# Patient Record
Sex: Female | Born: 1990
Health system: Southern US, Community
[De-identification: ages and names within clinical notes are randomized; demographics above are authoritative.]

## PROBLEM LIST (undated history)

## (undated) DIAGNOSIS — I10 Essential (primary) hypertension: Secondary | ICD-10-CM

## (undated) DIAGNOSIS — F3132 Bipolar disorder, current episode depressed, moderate: Secondary | ICD-10-CM

## (undated) DIAGNOSIS — L918 Other hypertrophic disorders of the skin: Secondary | ICD-10-CM

## (undated) HISTORY — DX: Essential (primary) hypertension: I10

## (undated) HISTORY — DX: Other hypertrophic disorders of the skin: L91.8

## (undated) HISTORY — DX: Morbid (severe) obesity due to excess calories: E66.01

## (undated) HISTORY — PX: KNEE ARTHROSCOPY: SUR90

## (undated) HISTORY — PX: KNEE SURGERY: SHX244

## (undated) HISTORY — DX: Bipolar disorder, current episode depressed, moderate: F31.32

## (undated) HISTORY — PX: INNER EAR SURGERY: SHX679

---
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
## 2014-12-27 ENCOUNTER — Encounter (HOSPITAL_COMMUNITY): Payer: Self-pay | Admitting: Physical Medicine and Rehabilitation

## 2014-12-27 ENCOUNTER — Emergency Department (HOSPITAL_COMMUNITY)
Admission: EM | Admit: 2014-12-27 | Discharge: 2014-12-27 | Discharge status: Against Medical Advice | Payer: Self-pay | Attending: Emergency Medicine | Admitting: Emergency Medicine

## 2014-12-27 DIAGNOSIS — F329 Major depressive disorder, single episode, unspecified: Secondary | ICD-10-CM | POA: Insufficient documentation

## 2014-12-27 DIAGNOSIS — Z046 Encounter for general psychiatric examination, requested by authority: Secondary | ICD-10-CM | POA: Insufficient documentation

## 2014-12-27 NOTE — ED Notes (Signed)
Patient states has been to [facility] and "I feel like a druggie or something.  I don't think that's where I need to be".

## 2014-12-27 NOTE — ED Notes (Signed)
Pt presents to department for evaluation of depression and severe mood swings. Reports crying and fits of anger. Denies SI/HI at the time. Pt is alert and oriented x4.

## 2014-12-27 NOTE — ED Notes (Signed)
Patient came to nurses station to advise that she spoke with her primary doctor and that she agreed to work patient in with payment plan.   Patient states she wanted to see her primary.

## 2014-12-27 NOTE — ED Provider Notes (Signed)
Patient here to be seen for mood swings and depression as recommended by her PCP. No Si/Hi  She decided to leave after staff told her that we don't write psych medications here and that she would most likely get  Resources. Patient eloped without my knowing and I was never able to speak with her or examine her.    Marlon Pel, PA-C 12/27/14 1513  Gwyneth Sprout, MD 12/27/14 2219

## 2015-09-18 ENCOUNTER — Emergency Department (HOSPITAL_BASED_OUTPATIENT_CLINIC_OR_DEPARTMENT_OTHER): Payer: BLUE CROSS/BLUE SHIELD

## 2015-09-18 ENCOUNTER — Encounter (HOSPITAL_BASED_OUTPATIENT_CLINIC_OR_DEPARTMENT_OTHER): Payer: Self-pay | Admitting: *Deleted

## 2015-09-18 ENCOUNTER — Emergency Department (HOSPITAL_BASED_OUTPATIENT_CLINIC_OR_DEPARTMENT_OTHER)
Admission: EM | Admit: 2015-09-18 | Discharge: 2015-09-19 | Disposition: A | Discharge status: Home / Self Care | Payer: BLUE CROSS/BLUE SHIELD | Attending: Emergency Medicine | Admitting: Emergency Medicine

## 2015-09-18 DIAGNOSIS — W19XXXA Unspecified fall, initial encounter: Secondary | ICD-10-CM

## 2015-09-18 DIAGNOSIS — M533 Sacrococcygeal disorders, not elsewhere classified: Secondary | ICD-10-CM | POA: Diagnosis not present

## 2015-09-18 DIAGNOSIS — F17210 Nicotine dependence, cigarettes, uncomplicated: Secondary | ICD-10-CM | POA: Diagnosis not present

## 2015-09-18 DIAGNOSIS — M549 Dorsalgia, unspecified: Secondary | ICD-10-CM | POA: Diagnosis present

## 2015-09-18 LAB — URINALYSIS, ROUTINE W REFLEX MICROSCOPIC
BILIRUBIN URINE: NEGATIVE
Glucose, UA: NEGATIVE mg/dL
HGB URINE DIPSTICK: NEGATIVE
Ketones, ur: NEGATIVE mg/dL
Leukocytes, UA: NEGATIVE
NITRITE: NEGATIVE
PROTEIN: NEGATIVE mg/dL
Specific Gravity, Urine: 1.026 (ref 1.005–1.030)
pH: 6 (ref 5.0–8.0)

## 2015-09-18 LAB — PREGNANCY, URINE: PREG TEST UR: NEGATIVE

## 2015-09-18 MED ORDER — NAPROXEN 250 MG PO TABS
500.0000 mg | ORAL_TABLET | Freq: Once | ORAL | Status: AC
  Administered 2015-09-18: 500 mg via ORAL
  Filled 2015-09-18: qty 2

## 2015-09-18 MED ORDER — METHOCARBAMOL 500 MG PO TABS
1000.0000 mg | ORAL_TABLET | Freq: Once | ORAL | Status: AC
  Administered 2015-09-18: 1000 mg via ORAL
  Filled 2015-09-18: qty 2

## 2015-09-18 NOTE — ED Notes (Signed)
MD at bedside. 

## 2015-09-18 NOTE — ED Notes (Signed)
t c/o fall from swing injuring lower back x 2 hrs ago

## 2015-09-18 NOTE — ED Provider Notes (Addendum)
CSN: [id number]     Arrival date & time 09/18/15  2226 History   By signing my name below, I, Shannon Morgan, attest that this documentation has been prepared under the direction and in the presence of Shannon Dement, MD . Electronically Signed: Freida Morgan, Scribe. 09/18/2015. 11:09 PM.    Chief Complaint  Patient presents with  . Back Pain   Patient is a 25 y.o. female presenting with back pain. The history is provided by the patient. No language interpreter was used.  Back Pain Location:  Gluteal region Radiates to:  Does not radiate Pain severity:  Moderate Timing:  Constant Chronicity:  New Context: falling   Relieved by:  Nothing Worsened by:  Nothing tried Associated symptoms: no abdominal pain, no abdominal swelling, no bladder incontinence, no bowel incontinence, no chest pain, no dysuria, no headaches, no leg pain, no numbness, no paresthesias, no perianal numbness, no tingling, no weakness and no weight loss   Risk factors: no hx of osteoporosis      HPI Comments:  Brelyn Morgan is a 25 y.o. female who presents to the Emergency Department complaining of moderate, constant, lower back pain following injury this evening ~ 2000. She was on a swing which broke and she fell ~ 4 feet onto the ground . She reports pain to her tailbone. No alleviating factors noted; no treatments tried. She denies bowel/bladder incontinence.  Pt has no other complaints or symptoms at this time.    History reviewed. No pertinent past medical history. Past Surgical History  Procedure Laterality Date  . Knee arthroscopy     History reviewed. No pertinent family history. Social History  Substance Use Topics  . Smoking status: Current Every Day Smoker -- 1.00 packs/day    Types: Cigarettes  . Smokeless tobacco: None  . Alcohol Use: No   OB History    No data available     Review of Systems  Constitutional: Negative for weight loss.  Cardiovascular: Negative for chest pain.   Gastrointestinal: Negative for abdominal pain and bowel incontinence.  Genitourinary: Negative for bladder incontinence and dysuria.  Musculoskeletal: Positive for back pain.  Neurological: Negative for tingling, speech difficulty, weakness, numbness, headaches and paresthesias.  All other systems reviewed and are negative.  Allergies  Codeine and Zoloft  Home Medications   Prior to Admission medications   Not on File   BP 118/67 mmHg  Pulse 91  Temp(Src) 99.1 F (37.3 C) (Oral)  Resp 16  Ht 5\' 8"  (1.727 m)  Wt 248 lb (112.492 kg)  BMI 37.72 kg/m2  SpO2 100% Physical Exam  Constitutional: She is oriented to person, place, and time. She appears well-developed and well-nourished. No distress.  Resting comfortably in the bed  HENT:  Head: Normocephalic and atraumatic.  Mouth/Throat: Oropharynx is clear and moist. No oropharyngeal exudate.  Moist mucous membranes   Eyes: Conjunctivae are normal. Pupils are equal, round, and reactive to light.  Neck: Normal range of motion. Neck supple. No JVD present.  Trachea midline  Cardiovascular: Normal rate, regular rhythm and normal heart sounds.   Pulmonary/Chest: Effort normal and breath sounds normal. No respiratory distress. She has no wheezes. She has no rales.  Abdominal: Soft. Bowel sounds are normal. She exhibits no distension. There is no rebound and no guarding.  Musculoskeletal: Normal range of motion. She exhibits no edema or tenderness.       Right hip: Normal.       Left hip: Normal.  Cervical back: Normal.       Thoracic back: Normal.       Lumbar back: Normal.   Gait intact  Neurological: She is alert and oriented to person, place, and time. She has normal reflexes. She displays normal reflexes. No cranial nerve deficit.  Intact L5/s1  Skin: Skin is warm and dry.  Psychiatric: She has a normal mood and affect. Her behavior is normal.  Nursing note and vitals reviewed.   ED Course  Procedures   DIAGNOSTIC  STUDIES:  Oxygen Saturation is 100% on RA, normal by my interpretation.    COORDINATION OF CARE:  11:06 PM Discussed treatment plan with pt at bedside and pt agreed to plan.  Labs Review Labs Reviewed  PREGNANCY, URINE  URINALYSIS, ROUTINE W REFLEX MICROSCOPIC (NOT AT [facility])    Imaging Review No results found. I have personally reviewed and evaluated these images and lab results as part of my medical decision-making.   EKG Interpretation None      MDM   Final diagnoses:  None   Filed Vitals:   09/18/15 2233 09/18/15 2302  BP: 138/87 118/67  Pulse: 125 91  Temp: 99.1 F (37.3 C)   Resp: 16 16   Results for orders placed or performed during the hospital encounter of 09/18/15  Pregnancy, urine  Result Value Ref Range   Preg Test, Ur NEGATIVE NEGATIVE  Urinalysis, Routine w reflex microscopic (not at [facility])  Result Value Ref Range   Color, Urine YELLOW YELLOW   APPearance CLOUDY (A) CLEAR   Specific Gravity, Urine 1.026 1.005 - 1.030   pH 6.0 5.0 - 8.0   Glucose, UA NEGATIVE NEGATIVE mg/dL   Hgb urine dipstick NEGATIVE NEGATIVE   Bilirubin Urine NEGATIVE NEGATIVE   Ketones, ur NEGATIVE NEGATIVE mg/dL   Protein, ur NEGATIVE NEGATIVE mg/dL   Nitrite NEGATIVE NEGATIVE   Leukocytes, UA NEGATIVE NEGATIVE   No results found.  Medications  naproxen (NAPROSYN) tablet 500 mg (500 mg Oral Given 09/18/15 2317)  methocarbamol (ROBAXIN) tablet 1,000 mg (1,000 mg Oral Given 09/18/15 2317)   Results for orders placed or performed during the hospital encounter of 09/18/15  Pregnancy, urine  Result Value Ref Range   Preg Test, Ur NEGATIVE NEGATIVE  Urinalysis, Routine w reflex microscopic (not at [facility])  Result Value Ref Range   Color, Urine YELLOW YELLOW   APPearance CLOUDY (A) CLEAR   Specific Gravity, Urine 1.026 1.005 - 1.030   pH 6.0 5.0 - 8.0   Glucose, UA NEGATIVE NEGATIVE mg/dL   Hgb urine dipstick NEGATIVE NEGATIVE   Bilirubin Urine NEGATIVE NEGATIVE   Ketones,  ur NEGATIVE NEGATIVE mg/dL   Protein, ur NEGATIVE NEGATIVE mg/dL   Nitrite NEGATIVE NEGATIVE   Leukocytes, UA NEGATIVE NEGATIVE   No results found.  Xray results are negative for fractures of the thoracic spine as well as sacrum and coccyx.  The XRays were read by Dr. Andria Meuse of radiology.   Will treat for pain and spasm with mobic and robaxin.  EDP apologized for the delay due to EPIC downtime.  EDP thoroughly reviewed the results of patient's urine tests and informed patient that all Xrays were read by Dr. Andria Meuse of radiology (a physician) and were normal and there were no fractures or dislocations of any kind.  Patient informed that she would be prescribed Mobic for pain and robaxin.  Patient was at first incredulous that there was not fracture but EDP stated it is come to have pain following a  fall and she may experience some discomfort for 3 to 4 days.  She should take all pain medication and muscle relaxants as directed.  Drink plenty of water as this will help the muscle relaxant to work more effectively and soak in warm baths. Patient also informed that she should use a stool softener as straining to have bowel movements may exacerbate pain.  She may also consider sitting on a foam donut for comfort.  Patient verbalizes understanding of all discharge instructions.  She is stable for discharge at this time.  Close follow up strict return precautions given   09/19/15 0001  Shakaya Bhullar, MD 09/19/15 [phone]

## 2015-09-19 MED ORDER — MELOXICAM 15 MG PO TABS: 15.0000 mg | ORAL_TABLET | Freq: Every day | ORAL | Status: DC

## 2015-09-19 MED ORDER — METHOCARBAMOL 500 MG PO TABS: 500.0000 mg | ORAL_TABLET | Freq: Two times a day (BID) | ORAL | Status: DC

## 2015-09-19 NOTE — ED Provider Notes (Signed)
Informed by Jonathon Jordanhassity, secretary, that immediately following discharge patient called back irritate because patient was told that the radiologist read the Xrays and that is what we were waiting on.  She states that physicians and not radiologists read Xrays.  Moreover, EDP was informed that the patient stated she was angry about having only been given a muscle relaxant without pain medication.  This is incorrect as the patient was given an RX for robaxin (muscle relaxant) and Mobic which is a strong NSAID pain reliever.  See discharge medication and AVS in EPIC for confirmation.  These were given to the patient by Cordie Griceuth Marcum at discharge following EDP lengthy discussion with the patient     Medication List    TAKE these medications        meloxicam 15 MG tablet  Commonly known as:  MOBIC  Take 1 tablet (15 mg total) by mouth daily.     methocarbamol 500 MG tablet  Commonly known as:  ROBAXIN  Take 1 tablet (500 mg total) by mouth 2 (two) times daily.         Cy BlamerApril Benjaman Artman, MD 09/19/15 [phone]

## 2015-09-19 NOTE — Discharge Instructions (Signed)
Tailbone Injury The tailbone is the small bone at the lower end of the backbone (spine). You may have stretched tissues, bruises, or a broken bone (fracture). These injuries can be painful. Most tailbone injuries get better on their own in 4-6 weeks. HOME CARE  Take medicines only as told by your doctor.  If told, apply ice to the injured area.  Put ice in a plastic bag.  Place a towel between your skin and the bag.  Leave the ice on for 20 minutes, 2-3 times per day. Do this for the first 1-2 days.  Sit on a large, rubber or inflated ring or cushion to lessen pain. Lean forward when you sit to help lessen pain.  Avoid sitting in one place for a long time.  Increase your activity as the pain allows.  Do exercises as told by your doctor or physical therapist.  If it is painful to poop, take medicine to help you poop (stool softeners) as told by your doctor.  Eat foods that have plenty of fiber.  Keep all follow-up visits as told by your doctor. This is important. GET HELP IF:  Your pain gets worse.  Pooping causes you pain.  You cannot poop (constipation).  You are leaking pee (urinary incontinence).  You have a fever.   This information is not intended to replace advice given to you by your health care provider. Make sure you discuss any questions you have with your health care provider.   Document Released: 05/24/2010 Document Revised: 09/05/2014 Document Reviewed: 04/17/2014 Elsevier Interactive Patient Education 2016 [employer]  

## 2016-12-02 DIAGNOSIS — H608X1 Other otitis externa, right ear: Secondary | ICD-10-CM | POA: Insufficient documentation

## 2017-06-29 DIAGNOSIS — I10 Essential (primary) hypertension: Secondary | ICD-10-CM | POA: Insufficient documentation

## 2019-01-15 DIAGNOSIS — H9203 Otalgia, bilateral: Secondary | ICD-10-CM | POA: Diagnosis not present

## 2019-01-15 DIAGNOSIS — H60502 Unspecified acute noninfective otitis externa, left ear: Secondary | ICD-10-CM | POA: Diagnosis not present

## 2019-01-17 DIAGNOSIS — H9203 Otalgia, bilateral: Secondary | ICD-10-CM | POA: Diagnosis not present

## 2019-01-17 DIAGNOSIS — H70003 Acute mastoiditis without complications, bilateral: Secondary | ICD-10-CM | POA: Diagnosis not present

## 2019-01-17 DIAGNOSIS — H7090 Unspecified mastoiditis, unspecified ear: Secondary | ICD-10-CM | POA: Insufficient documentation

## 2019-01-20 DIAGNOSIS — H6523 Chronic serous otitis media, bilateral: Secondary | ICD-10-CM | POA: Insufficient documentation

## 2019-01-20 DIAGNOSIS — H70003 Acute mastoiditis without complications, bilateral: Secondary | ICD-10-CM | POA: Insufficient documentation

## 2019-06-16 DIAGNOSIS — Z20822 Contact with and (suspected) exposure to covid-19: Secondary | ICD-10-CM | POA: Diagnosis not present

## 2019-07-29 DIAGNOSIS — R110 Nausea: Secondary | ICD-10-CM | POA: Diagnosis not present

## 2019-07-29 DIAGNOSIS — Z1152 Encounter for screening for COVID-19: Secondary | ICD-10-CM | POA: Diagnosis not present

## 2019-07-29 DIAGNOSIS — R21 Rash and other nonspecific skin eruption: Secondary | ICD-10-CM | POA: Diagnosis not present

## 2019-08-03 ENCOUNTER — Encounter (HOSPITAL_BASED_OUTPATIENT_CLINIC_OR_DEPARTMENT_OTHER): Payer: Self-pay | Admitting: *Deleted

## 2019-08-03 ENCOUNTER — Emergency Department (HOSPITAL_BASED_OUTPATIENT_CLINIC_OR_DEPARTMENT_OTHER): Payer: BC Managed Care – PPO

## 2019-08-03 ENCOUNTER — Other Ambulatory Visit: Payer: Self-pay

## 2019-08-03 ENCOUNTER — Emergency Department (HOSPITAL_BASED_OUTPATIENT_CLINIC_OR_DEPARTMENT_OTHER)
Admission: EM | Admit: 2019-08-03 | Discharge: 2019-08-04 | Disposition: A | Discharge status: Home / Self Care | Payer: BC Managed Care – PPO | Attending: Emergency Medicine | Admitting: Emergency Medicine

## 2019-08-03 DIAGNOSIS — R3915 Urgency of urination: Secondary | ICD-10-CM | POA: Insufficient documentation

## 2019-08-03 DIAGNOSIS — R3129 Other microscopic hematuria: Secondary | ICD-10-CM | POA: Diagnosis not present

## 2019-08-03 DIAGNOSIS — R112 Nausea with vomiting, unspecified: Secondary | ICD-10-CM | POA: Insufficient documentation

## 2019-08-03 DIAGNOSIS — R300 Dysuria: Secondary | ICD-10-CM | POA: Diagnosis not present

## 2019-08-03 DIAGNOSIS — F17210 Nicotine dependence, cigarettes, uncomplicated: Secondary | ICD-10-CM | POA: Diagnosis not present

## 2019-08-03 DIAGNOSIS — R350 Frequency of micturition: Secondary | ICD-10-CM | POA: Insufficient documentation

## 2019-08-03 LAB — CBC WITH DIFFERENTIAL/PLATELET
Abs Immature Granulocytes: 0.12 10*3/uL — ABNORMAL HIGH (ref 0.00–0.07)
Basophils Absolute: 0.1 10*3/uL (ref 0.0–0.1)
Basophils Relative: 1 %
Eosinophils Absolute: 0.3 10*3/uL (ref 0.0–0.5)
Eosinophils Relative: 2 %
HCT: 39.6 % (ref 36.0–46.0)
Hemoglobin: 13.2 g/dL (ref 12.0–15.0)
Immature Granulocytes: 1 %
Lymphocytes Relative: 29 %
Lymphs Abs: 3.8 10*3/uL (ref 0.7–4.0)
MCH: 31.3 pg (ref 26.0–34.0)
MCHC: 33.3 g/dL (ref 30.0–36.0)
MCV: 93.8 fL (ref 80.0–100.0)
Monocytes Absolute: 0.8 10*3/uL (ref 0.1–1.0)
Monocytes Relative: 6 %
Neutro Abs: 8.2 10*3/uL — ABNORMAL HIGH (ref 1.7–7.7)
Neutrophils Relative %: 61 %
Platelets: 444 10*3/uL — ABNORMAL HIGH (ref 150–400)
RBC: 4.22 MIL/uL (ref 3.87–5.11)
RDW: 12 % (ref 11.5–15.5)
WBC: 13.9 10*3/uL — ABNORMAL HIGH (ref 4.0–10.5)
nRBC: 0 % (ref 0.0–0.2)

## 2019-08-03 LAB — COMPREHENSIVE METABOLIC PANEL
ALT: 17 U/L (ref 0–44)
AST: 18 U/L (ref 15–41)
Albumin: 3.8 g/dL (ref 3.5–5.0)
Alkaline Phosphatase: 92 U/L (ref 38–126)
Anion gap: 8 (ref 5–15)
BUN: 11 mg/dL (ref 6–20)
CO2: 23 mmol/L (ref 22–32)
Calcium: 8.7 mg/dL — ABNORMAL LOW (ref 8.9–10.3)
Chloride: 107 mmol/L (ref 98–111)
Creatinine, Ser: 0.67 mg/dL (ref 0.44–1.00)
GFR calc Af Amer: >60 mL/min (ref >60)
GFR calc non Af Amer: >60 mL/min (ref >60)
Glucose, Bld: 124 mg/dL — ABNORMAL HIGH (ref 70–99)
Potassium: 3.8 mmol/L (ref 3.5–5.1)
Sodium: 138 mmol/L (ref 135–145)
Total Bilirubin: 0.5 mg/dL (ref 0.3–1.2)
Total Protein: 7 g/dL (ref 6.5–8.1)

## 2019-08-03 LAB — URINALYSIS, ROUTINE W REFLEX MICROSCOPIC
Bilirubin Urine: NEGATIVE
Glucose, UA: NEGATIVE mg/dL
Ketones, ur: NEGATIVE mg/dL
Leukocytes,Ua: NEGATIVE
Nitrite: NEGATIVE
Protein, ur: NEGATIVE mg/dL
Specific Gravity, Urine: 1.02 (ref 1.005–1.030)
pH: 6.5 (ref 5.0–8.0)

## 2019-08-03 LAB — URINALYSIS, MICROSCOPIC (REFLEX)

## 2019-08-03 LAB — PREGNANCY, URINE: Preg Test, Ur: NEGATIVE

## 2019-08-03 LAB — LIPASE, BLOOD: Lipase: 28 U/L (ref 11–51)

## 2019-08-03 MED ORDER — KETOROLAC TROMETHAMINE 30 MG/ML IJ SOLN
30.0000 mg | Freq: Once | INTRAMUSCULAR | Status: AC
  Administered 2019-08-03: 30 mg via INTRAVENOUS
  Filled 2019-08-03: qty 1

## 2019-08-03 MED ORDER — ONDANSETRON HCL 4 MG/2ML IJ SOLN
4.0000 mg | Freq: Once | INTRAMUSCULAR | Status: AC
  Administered 2019-08-03: 4 mg via INTRAVENOUS
  Filled 2019-08-03: qty 2

## 2019-08-03 NOTE — ED Provider Notes (Signed)
[facility] EMERGENCY DEPARTMENT Provider Note   CSN: [id number] Arrival date & time: 08/03/19  2234   History Chief Complaint  Patient presents with  . Flank Pain    Shannon Morgan is a 29 y.o. female.  The history is provided by the patient.  Flank Pain  She has no significant past medical history and comes in with 1 week history of dysuria along with urinary urgency, frequency, hesitancy, tenesmus.  Yesterday, she developed right flank pain along with nausea and vomiting.  She saw her primary care provider who noted blood in the urine and sent for culture and gave her injection of ketorolac which did give temporary relief.  She was given a prescription for antibiotics but told not to fill it unless the culture showed some infection.  She has not had any fever.  Pain seems to be better when she holds her breath.  She currently rates pain at 7/10.  She is currently on her menses.  History reviewed. No pertinent past medical history.  There are no problems to display for this patient.   Past Surgical History:  Procedure Laterality Date  . INNER EAR SURGERY    . KNEE ARTHROSCOPY       OB History   No obstetric history on file.     History reviewed. No pertinent family history.  Social History   Tobacco Use  . Smoking status: Current Every Day Smoker    Packs/day: 1.00    Types: Cigarettes  Substance Use Topics  . Alcohol use: No  . Drug use: No    Home Medications Prior to Admission medications   Medication Sig Start Date End Date Taking? Authorizing Provider  meloxicam (MOBIC) 15 MG tablet Take 1 tablet (15 mg total) by mouth daily. 09/19/15   Palumbo, April, MD  methocarbamol (ROBAXIN) 500 MG tablet Take 1 tablet (500 mg total) by mouth 2 (two) times daily. 09/19/15   Palumbo, April, MD    Allergies    Penicillins, Codeine, Cranberry, and Zoloft [sertraline hcl]  Review of Systems   Review of Systems  Genitourinary: Positive for flank pain.  All  other systems reviewed and are negative.   Physical Exam Updated Vital Signs BP (!) 152/83 (BP Location: Right Arm)   Pulse 95   Temp 98.5 F (36.9 C) (Oral)   Resp 18   Ht 5\' 8"  (1.727 m)   Wt 121.6 kg   SpO2 99%   BMI 40.75 kg/m   Physical Exam Vitals and nursing note reviewed.   29 year old female, resting comfortably and in no acute distress. Vital signs are significant for elevated blood pressure. Oxygen saturation is 99%, which is normal. Head is normocephalic and atraumatic. PERRLA, EOMI. Oropharynx is clear. Neck is nontender and supple without adenopathy or JVD. Back is nontender in the midline.  There is moderate right CVA tenderness. Lungs are clear without rales, wheezes, or rhonchi. Chest is nontender. Heart has regular rate and rhythm without murmur. Abdomen is soft, flat, with mild to moderate right lower quadrant tenderness.  There is no rebound or guarding.  There are no masses or hepatosplenomegaly and peristalsis is hypoactive. Extremities have no cyanosis or edema, full range of motion is present. Skin is warm and dry without rash. Neurologic: Mental status is normal, cranial nerves are intact, there are no motor or sensory deficits.  ED Results / Procedures / Treatments   Labs (all labs ordered are listed, but only abnormal results are displayed) Labs Reviewed  URINALYSIS, ROUTINE W REFLEX MICROSCOPIC - Abnormal; Notable for the following components:      Result Value   Hgb urine dipstick MODERATE (*)    All other components within normal limits  CBC WITH DIFFERENTIAL/PLATELET - Abnormal; Notable for the following components:   WBC 13.9 (*)    Platelets 444 (*)    Neutro Abs 8.2 (*)    Abs Immature Granulocytes 0.12 (*)    All other components within normal limits  COMPREHENSIVE METABOLIC PANEL - Abnormal; Notable for the following components:   Glucose, Bld 124 (*)    Calcium 8.7 (*)    All other components within normal limits  URINALYSIS,  MICROSCOPIC (REFLEX) - Abnormal; Notable for the following components:   Bacteria, UA FEW (*)    All other components within normal limits  PREGNANCY, URINE  LIPASE, BLOOD   Radiology CT Renal Stone Study  Result Date: 08/04/2019 CLINICAL DATA:  Right flank pain x3 days, hematuria EXAM: CT ABDOMEN AND PELVIS WITHOUT CONTRAST TECHNIQUE: Multidetector CT imaging of the abdomen and pelvis was performed following the standard protocol without IV contrast. COMPARISON:  None. FINDINGS: Lower chest: Lung bases are clear. Hepatobiliary: Unenhanced liver is unremarkable. Gallbladder is unremarkable. No intrahepatic or extrahepatic ductal dilatation. Pancreas: Within normal limits. Spleen: Within normal limits. Adrenals/Urinary Tract: Adrenal glands are within normal limits. Kidneys are within normal limits. No renal, ureteral, or bladder calculi. No hydronephrosis. Bladder is underdistended but unremarkable. Stomach/Bowel: Stomach is within normal limits. No evidence of bowel obstruction. Normal appendix (series 2/image 68), mildly prominent but without inflammatory changes. Vascular/Lymphatic: No evidence of abdominal aortic aneurysm. No suspicious abdominopelvic lymphadenopathy. Reproductive: Uterus is within normal limits. Bilateral ovaries are within normal limits. Other: No abdominopelvic ascites. Musculoskeletal: Visualized osseous structures are within normal limits. IMPRESSION: Negative CT abdomen/pelvis. Electronically Signed   By: Julian Hy M.D.   On: 08/04/2019 00:02    Procedures Procedures  Medications Ordered in ED Medications  ketorolac (TORADOL) 30 MG/ML injection 30 mg (30 mg Intravenous Given 08/03/19 2323)  ondansetron (ZOFRAN) injection 4 mg (4 mg Intravenous Given 08/03/19 2321)    ED Course  I have reviewed the triage vital signs and the nursing notes.  Pertinent labs & imaging results that were available during my care of the patient were reviewed by me and considered in my  medical decision making (see chart for details).  MDM Rules/Calculators/A&P Right flank pain suspicious for ureterolithiasis.  Consider UTI with pyelonephritis.  Doubt appendicitis, cholelithiasis, pancreatitis.  Old records are reviewed confirming office visit yesterday and urinalysis showing too numerous to count RBCs with 0-5 WBCs.  Abdominal ultrasound in 2011 showed no evidence of kidney stones.  Will send for renal stone protocol CT scan.  She will also be given a good dose of ketorolac, screening labs checked.  She had modest relief of pain with ketorolac.  Urinalysis does show microscopic hematuria, but she is currently on her menses.  CT shows no evidence of urolithiasis.  Cause of symptoms is unclear.  We will treat her symptomatically with oxybutynin and she is referred to urology for further outpatient work-up.  Final Clinical Impression(s) / ED Diagnoses Final diagnoses:  Dysuria  Microscopic hematuria    Rx / DC Orders ED Discharge Orders         Ordered    oxybutynin (DITROPAN) 5 MG tablet  3 times daily     08/04/19 9147           Delora Fuel, MD 82/95/62  0024  

## 2019-08-03 NOTE — ED Triage Notes (Signed)
Pt reports right flank pain x 3 days. Went to PCP and had urine tested but has not gotten results. States increased pain suddenly today. Dysuria and urinary retention.

## 2019-08-04 MED ORDER — OXYBUTYNIN CHLORIDE 5 MG PO TABS: 5.0000 mg | ORAL_TABLET | Freq: Three times a day (TID) | ORAL | 0 refills | Status: DC

## 2019-08-04 NOTE — Discharge Instructions (Signed)
Your urine, blood tests, CT scan did not show the cause for your symptoms. Please follow up with the urologist for further evaluation.  Drink plenty of fluids.  Take ibuprofen or naproxen as needed.  You may take acetaminophen in addition to ibuprofen/naproxen for additional pain relief.  Return if you are having any problems.

## 2019-08-18 ENCOUNTER — Other Ambulatory Visit: Payer: Self-pay

## 2019-08-18 ENCOUNTER — Encounter: Payer: Self-pay | Admitting: Physician Assistant

## 2019-08-18 ENCOUNTER — Ambulatory Visit (INDEPENDENT_AMBULATORY_CARE_PROVIDER_SITE_OTHER): Payer: BC Managed Care – PPO | Admitting: Physician Assistant

## 2019-08-18 VITALS — BP 128/72 | HR 84 | Temp 97.7°F | Ht 67.5 in | Wt 286.0 lb

## 2019-08-18 DIAGNOSIS — R5383 Other fatigue: Secondary | ICD-10-CM | POA: Insufficient documentation

## 2019-08-18 DIAGNOSIS — N921 Excessive and frequent menstruation with irregular cycle: Secondary | ICD-10-CM | POA: Insufficient documentation

## 2019-08-18 DIAGNOSIS — F419 Anxiety disorder, unspecified: Secondary | ICD-10-CM | POA: Insufficient documentation

## 2019-08-18 MED ORDER — BUSPIRONE HCL 5 MG PO TABS: 5.0000 mg | ORAL_TABLET | Freq: Two times a day (BID) | ORAL | 1 refills | Status: DC

## 2019-08-18 NOTE — Assessment & Plan Note (Signed)
Trial buspar 5mg  bid To call in 3 weeks and notify how med working Follow up in 2 months

## 2019-08-18 NOTE — Assessment & Plan Note (Signed)
CONTINUE TO WATCH DIET START TO EXERCISE WILL TRY ADIPEX AFTER BEING ON BUSPAR FOR FEW WEEKS FIRST

## 2019-08-18 NOTE — Progress Notes (Signed)
Established Patient Office Visit  Subjective:  Patient ID: Shannon Morgan, female    DOB: 28-Nov-1990  Age: 29 y.o. MRN: [id number]  CC:  Chief Complaint  Patient presents with  . fatigue        HPI Shannon Morgan presents for fatigue Pt states she has been very tired and fatigued for several months - she also mentions she has had menorrhagia for the past 4 months and has stayed on her period that whole time.  She does have a nexplanon in her arm and it is overdue to be removed - she would like to make GYN appt  Pt having lots of anxiety, worry, 'ups and downs' - would like to try medication to help . She has tried buspar in the past and it helped her  Pt with morbid obesity - states she tries to watch diet but has not had regular exercise - she had been on adipex in the past and would like to try medication again  Past Medical History:  Diagnosis Date  . Bipolar disorder, current episode depressed, moderate (HCC)   . Essential (primary) hypertension   . Morbid (severe) obesity due to excess calories (HCC)   . Other hypertrophic disorders of the skin     Past Surgical History:  Procedure Laterality Date  . INNER EAR SURGERY    . KNEE ARTHROSCOPY    . KNEE SURGERY      Family History  Problem Relation Age of Onset  . Hypertension Mother   . Asthma Sister   . Arrhythmia Other   . Cerebrovascular Accident Other   . Asthma Other     Social History   Socioeconomic History  . Marital status: Single    Spouse name: Not on file  . Number of children: Not on file  . Years of education: Not on file  . Highest education level: Not on file  Occupational History  . Occupation: Barrister's clerk  Tobacco Use  . Smoking status: Current Every Day Smoker    Packs/day: 1.00    Types: Cigarettes  . Smokeless tobacco: Never Used  Substance and Sexual Activity  . Alcohol use: No  . Drug use: No  . Sexual activity: Yes    Birth control/protection: None, Implant  Other  Topics Concern  . Not on file  Social History Narrative  . Not on file   Social Determinants of Health   Financial Resource Strain:   . Difficulty of Paying Living Expenses:   Food Insecurity:   . Worried About Programme researcher, broadcasting/film/video in the Last Year:   . Barista in the Last Year:   Transportation Needs:   . Freight forwarder (Medical):   Marland Kitchen Lack of Transportation (Non-Medical):   Physical Activity:   . Days of Exercise per Week:   . Minutes of Exercise per Session:   Stress:   . Feeling of Stress :   Social Connections:   . Frequency of Communication with Friends and Family:   . Frequency of Social Gatherings with Friends and Family:   . Attends Religious Services:   . Active Member of Clubs or Organizations:   . Attends Banker Meetings:   Marland Kitchen Marital Status:   Intimate Partner Violence:   . Fear of Current or Ex-Partner:   . Emotionally Abused:   Marland Kitchen Physically Abused:   . Sexually Abused:      Current Outpatient Medications:  .  busPIRone (BUSPAR) 5 MG tablet, Take  1 tablet (5 mg total) by mouth 2 (two) times daily., Disp: 60 tablet, Rfl: 1   Allergies  Allergen Reactions  . Penicillins Anaphylaxis and Rash  . Other Other (See Comments)    suicidal  . Sertraline Itching and Other (See Comments)    suicidal suicidal suicidal suicidal suicidal   . Cranberry   . Zoloft [Sertraline Hcl]   . Codeine Rash    ROS CONSTITUTIONAL: Negative for chills, fatigue, fever, unintentional weight gain and unintentional weight loss.  E/N/T: Negative for ear pain, nasal congestion and sore throat.  CARDIOVASCULAR: Negative for chest pain, dizziness, palpitations and pedal edema.  RESPIRATORY: Negative for recent cough and dyspnea.  GASTROINTESTINAL: Negative for abdominal pain, acid reflux symptoms, constipation, diarrhea, nausea and vomiting.  MSK: Negative for arthralgias and myalgias.  INTEGUMENTARY: Negative for rash.  NEUROLOGICAL: Negative for  dizziness and headaches.  PSYCHIATRIC: Negative for sleep disturbance and to question depression screen.  Negative for depression, negative for anhedonia.        Objective:    PHYSICAL EXAM:   VS: BP 128/72 (BP Location: Right Arm, Patient Position: Sitting)   Pulse 84   Temp 97.7 F (36.5 C) (Temporal)   Ht 5' 7.5" (1.715 m)   Wt 286 lb (129.7 kg)   SpO2 100%   BMI 44.13 kg/m   GEN: Well nourished, well developed, in no acute distress - morbidly obese  Cardiac: RRR; no murmurs, rubs, or gallops,no edema - no significant varicosities Respiratory:  normal respiratory rate and pattern with no distress - normal breath sounds with no rales, rhonchi, wheezes or rubs  Skin: warm and dry, no rash  Neuro:  Alert and Oriented x 3, Strength and sensation are intact - CN II-Xii grossly intact Psych: euthymic mood, appropriate affect and demeanor  BP 128/72 (BP Location: Right Arm, Patient Position: Sitting)   Pulse 84   Temp 97.7 F (36.5 C) (Temporal)   Ht 5' 7.5" (1.715 m)   Wt 286 lb (129.7 kg)   SpO2 100%   BMI 44.13 kg/m  Wt Readings from Last 3 Encounters:  08/18/19 286 lb (129.7 kg)  08/03/19 268 lb (121.6 kg)  09/18/15 248 lb (112.5 kg)     Health Maintenance Due  Topic Date Due  . HIV Screening  Never done  . TETANUS/TDAP  Never done  . PAP-Cervical Cytology Screening  Never done  . PAP SMEAR-Modifier  Never done    There are no preventive care reminders to display for this patient.  No results found for: TSH Lab Results  Component Value Date   WBC 13.9 (H) 08/03/2019   HGB 13.2 08/03/2019   HCT 39.6 08/03/2019   MCV 93.8 08/03/2019   PLT 444 (H) 08/03/2019   Lab Results  Component Value Date   NA 138 08/03/2019   K 3.8 08/03/2019   CO2 23 08/03/2019   GLUCOSE 124 (H) 08/03/2019   BUN 11 08/03/2019   CREATININE 0.67 08/03/2019   BILITOT 0.5 08/03/2019   ALKPHOS 92 08/03/2019   AST 18 08/03/2019   ALT 17 08/03/2019   PROT 7.0 08/03/2019    ALBUMIN 3.8 08/03/2019   CALCIUM 8.7 (L) 08/03/2019   ANIONGAP 8 08/03/2019   No results found for: CHOL No results found for: HDL No results found for: LDLCALC No results found for: TRIG No results found for: CHOLHDL No results found for: HGBA1C    Assessment & Plan:   Problem List Items Addressed This Visit  Other   Other fatigue - Primary    labwork pending      Relevant Orders   CBC with Differential/Platelet   Comprehensive metabolic panel   TSH   Anxiety    Trial buspar 5mg  bid To call in 3 weeks and notify how med working Follow up in 2 months      Relevant Medications   busPIRone (BUSPAR) 5 MG tablet   Morbid obesity (HCC)    CONTINUE TO WATCH DIET START TO EXERCISE WILL TRY ADIPEX AFTER BEING ON BUSPAR FOR FEW WEEKS FIRST      Menorrhagia with irregular cycle    REFERRAL TO GYN      Relevant Orders   Ambulatory referral to Gynecology      Meds ordered this encounter  Medications  . busPIRone (BUSPAR) 5 MG tablet    Sig: Take 1 tablet (5 mg total) by mouth 2 (two) times daily.    Dispense:  60 tablet    Refill:  1    Order Specific Question:   Supervising Provider    Answer    Follow-up: No follow-ups on file.    SARA R Emmitte Surgeon, PA-C

## 2019-08-18 NOTE — Assessment & Plan Note (Signed)
labwork pending 

## 2019-08-18 NOTE — Assessment & Plan Note (Signed)
REFERRAL TO GYN

## 2019-08-19 LAB — CBC WITH DIFFERENTIAL/PLATELET
Basophils Absolute: 0.1 10*3/uL (ref 0.0–0.2)
Basos: 1 %
EOS (ABSOLUTE): 0.1 10*3/uL (ref 0.0–0.4)
Eos: 1 %
Hematocrit: 40.9 % (ref 34.0–46.6)
Hemoglobin: 14.3 g/dL (ref 11.1–15.9)
Immature Grans (Abs): 0.1 10*3/uL (ref 0.0–0.1)
Immature Granulocytes: 1 %
Lymphocytes Absolute: 3 10*3/uL (ref 0.7–3.1)
Lymphs: 29 %
MCH: 32.5 pg (ref 26.6–33.0)
MCHC: 35 g/dL (ref 31.5–35.7)
MCV: 93 fL (ref 79–97)
Monocytes Absolute: 0.6 10*3/uL (ref 0.1–0.9)
Monocytes: 6 %
Neutrophils Absolute: 6.4 10*3/uL (ref 1.4–7.0)
Neutrophils: 62 %
Platelets: 454 10*3/uL — ABNORMAL HIGH (ref 150–450)
RBC: 4.4 x10E6/uL (ref 3.77–5.28)
RDW: 12.2 % (ref 11.7–15.4)
WBC: 10.2 10*3/uL (ref 3.4–10.8)

## 2019-08-19 LAB — COMPREHENSIVE METABOLIC PANEL
ALT: 23 IU/L (ref 0–32)
AST: 21 IU/L (ref 0–40)
Albumin/Globulin Ratio: 1.6 (ref 1.2–2.2)
Albumin: 4.5 g/dL (ref 3.9–5.0)
Alkaline Phosphatase: 121 IU/L — ABNORMAL HIGH (ref 39–117)
BUN/Creatinine Ratio: 18 (ref 9–23)
BUN: 13 mg/dL (ref 6–20)
Bilirubin Total: 0.5 mg/dL (ref 0.0–1.2)
CO2: 20 mmol/L (ref 20–29)
Calcium: 9.9 mg/dL (ref 8.7–10.2)
Chloride: 105 mmol/L (ref 96–106)
Creatinine, Ser: 0.73 mg/dL (ref 0.57–1.00)
GFR calc Af Amer: 131 mL/min/{1.73_m2} (ref >59)
GFR calc non Af Amer: 113 mL/min/{1.73_m2} (ref >59)
Globulin, Total: 2.8 g/dL (ref 1.5–4.5)
Glucose: 89 mg/dL (ref 65–99)
Potassium: 4.4 mmol/L (ref 3.5–5.2)
Sodium: 139 mmol/L (ref 134–144)
Total Protein: 7.3 g/dL (ref 6.0–8.5)

## 2019-08-19 LAB — TSH: TSH: 2.68 u[IU]/mL (ref 0.450–4.500)

## 2019-08-29 ENCOUNTER — Other Ambulatory Visit: Payer: Self-pay

## 2019-08-29 MED ORDER — PHENTERMINE HCL 37.5 MG PO TABS: 37.5000 mg | ORAL_TABLET | Freq: Every day | ORAL | 0 refills | Status: DC

## 2019-10-18 ENCOUNTER — Ambulatory Visit: Payer: Self-pay | Admitting: Physician Assistant

## 2019-12-16 ENCOUNTER — Telehealth: Payer: Self-pay

## 2019-12-16 NOTE — Telephone Encounter (Signed)
Shannon Morgan called to report that she has been treating a possible stye on her eyelid for the last week.  She has been using OTC stye relief with no improvement.  She awakened this morning with increased swelling and discoloration and she describes the area as looking more like a blister today.  She was instructed to go to the eye physician or Urgent Care for evaluation and treatment.

## 2020-04-25 DIAGNOSIS — M791 Myalgia, unspecified site: Secondary | ICD-10-CM | POA: Diagnosis not present

## 2020-04-25 DIAGNOSIS — Z20822 Contact with and (suspected) exposure to covid-19: Secondary | ICD-10-CM | POA: Diagnosis not present

## 2020-04-25 DIAGNOSIS — R051 Acute cough: Secondary | ICD-10-CM | POA: Diagnosis not present

## 2020-04-25 DIAGNOSIS — R062 Wheezing: Secondary | ICD-10-CM | POA: Diagnosis not present

## 2020-05-03 DIAGNOSIS — R0981 Nasal congestion: Secondary | ICD-10-CM | POA: Insufficient documentation

## 2020-05-03 DIAGNOSIS — Z20822 Contact with and (suspected) exposure to covid-19: Secondary | ICD-10-CM | POA: Insufficient documentation

## 2020-05-03 DIAGNOSIS — R519 Headache, unspecified: Secondary | ICD-10-CM | POA: Insufficient documentation

## 2020-05-03 DIAGNOSIS — J029 Acute pharyngitis, unspecified: Secondary | ICD-10-CM | POA: Insufficient documentation

## 2020-05-11 ENCOUNTER — Telehealth (INDEPENDENT_AMBULATORY_CARE_PROVIDER_SITE_OTHER): Payer: Self-pay | Admitting: Physician Assistant

## 2020-05-11 ENCOUNTER — Other Ambulatory Visit: Payer: Self-pay | Admitting: Physician Assistant

## 2020-05-11 ENCOUNTER — Encounter: Payer: Self-pay | Admitting: Physician Assistant

## 2020-05-11 VITALS — Temp 94.4°F | Wt 276.0 lb

## 2020-05-11 DIAGNOSIS — J40 Bronchitis, not specified as acute or chronic: Secondary | ICD-10-CM

## 2020-05-11 MED ORDER — HYDROCODONE-HOMATROPINE 5-1.5 MG/5ML PO SYRP: 5.0000 mL | ORAL_SOLUTION | Freq: Three times a day (TID) | ORAL | 0 refills | Status: DC | PRN

## 2020-05-11 MED ORDER — PREDNISONE 20 MG PO TABS: ORAL_TABLET | ORAL | 0 refills | Status: AC

## 2020-05-11 MED ORDER — AZITHROMYCIN 250 MG PO TABS: ORAL_TABLET | ORAL | 0 refills | Status: DC

## 2020-05-11 MED ORDER — HYDROCOD POLST-CPM POLST ER 10-8 MG/5ML PO SUER: 5.0000 mL | Freq: Two times a day (BID) | ORAL | 0 refills | Status: DC

## 2020-05-11 NOTE — Progress Notes (Signed)
Virtual Visit via Telephone Note   This visit type was conducted due to national recommendations for restrictions regarding the COVID-19 Pandemic (e.g. social distancing) in an effort to limit this patient's exposure and mitigate transmission in our community.  Due to her co-morbid illnesses, this patient is at least at moderate risk for complications without adequate follow up.  This format is felt to be most appropriate for this patient at this time.  The patient did not have access to video technology/had technical difficulties with video requiring transitioning to audio format only (telephone).  All issues noted in this document were discussed and addressed.  No physical exam could be performed with this format.  Patient verbally consented to a telehealth visit.   Date:  05/11/2020   ID:  Shannon Morgan, DOB 04/05/1991, MRN [id number]  Patient Location: Home Provider Location: Office  PCP:  Marianne Sofia, PA-C    Chief Complaint:  URI/cough  History of Present Illness:    Shannon Morgan is a 30 y.o. female with URI/cough since 04/25/2020 Pt states that she was seen at Urgent Care on 04/25/20 - was diagnosed with pneumonia and strep but was only given prednisone and tessalon perles - she had a negative flu and negative COVID test However on 12/25 she did an at home test and tested positive for COVID and lost her taste and smell She continues to have chest congestion, sore throat, pnd and malaise She does have an albuterol inhaler  The patient does have symptoms concerning for COVID-19 infection (fever, chills, cough, or new shortness of breath).    Past Medical History:  Diagnosis Date  . Bipolar disorder, current episode depressed, moderate (HCC)   . Essential (primary) hypertension   . Morbid (severe) obesity due to excess calories (HCC)   . Other hypertrophic disorders of the skin    Past Surgical History:  Procedure Laterality Date  . INNER EAR SURGERY    . KNEE  ARTHROSCOPY    . KNEE SURGERY       Current Meds  Medication Sig  . azithromycin (ZITHROMAX) 250 MG tablet 2 po day one then one po days 2-5  . busPIRone (BUSPAR) 5 MG tablet Take 1 tablet (5 mg total) by mouth 2 (two) times daily.  Marland Kitchen HYDROcodone-homatropine (HYCODAN) 5-1.5 MG/5ML syrup Take 5 mLs by mouth every 8 (eight) hours as needed for cough.  . phentermine (ADIPEX-P) 37.5 MG tablet Take 1 tablet (37.5 mg total) by mouth daily before breakfast.  . predniSONE (DELTASONE) 20 MG tablet Take 3 tablets (60 mg total) by mouth daily with breakfast for 3 days, THEN 2 tablets (40 mg total) daily with breakfast for 3 days, THEN 1 tablet (20 mg total) daily with breakfast for 3 days.     Allergies:   Penicillins, Other, Sertraline, Amoxicillin, Cranberry, Zoloft [sertraline hcl], and Codeine   Social History   Tobacco Use  . Smoking status: Current Every Day Smoker    Packs/day: 1.00    Types: Cigarettes  . Smokeless tobacco: Never Used  Vaping Use  . Vaping Use: Never used  Substance Use Topics  . Alcohol use: No  . Drug use: No     Family Hx: The patient's family history includes Arrhythmia in an other family member; Asthma in her sister and another family member; Cerebrovascular Accident in an other family member; Hypertension in her mother.  ROS:   Please see the history of present illness.    All other systems reviewed and are negative.  Labs/Other Tests and Data Reviewed:    Recent Labs: 08/18/2019: ALT 23; BUN 13; Creatinine, Ser 0.73; Hemoglobin 14.3; Platelets 454; Potassium 4.4; Sodium 139; TSH 2.680   Recent Lipid Panel No results found for: CHOL, TRIG, HDL, CHOLHDL, LDLCALC, LDLDIRECT  Wt Readings from Last 3 Encounters:  05/11/20 276 lb (125.2 kg)  08/18/19 286 lb (129.7 kg)  08/03/19 268 lb (121.6 kg)     Objective:    Vital Signs:  Temp (!) 94.4 F (34.7 C)   Wt 276 lb (125.2 kg)   BMI 42.59 kg/m    VITAL SIGNS:  reviewed  ASSESSMENT & PLAN:     1. Bronchitis - rx for zpack, hydromet and prednisone - continue albuterol as directed - follow up if any symptoms change or worsen  COVID-19 Education: The signs and symptoms of COVID-19 were discussed with the patient and how to seek care for testing (follow up with PCP or arrange E-visit). The importance of social distancing was discussed today.  Time:   Today, I have spent 10 minutes with the patient with telehealth technology discussing the above problems.     Medication Adjustments/Labs and Tests Ordered: Current medicines are reviewed at length with the patient today.  Concerns regarding medicines are outlined above.   Tests Ordered: No orders of the defined types were placed in this encounter.   Medication Changes: Meds ordered this encounter  Medications  . azithromycin (ZITHROMAX) 250 MG tablet    Sig: 2 po day one then one po days 2-5    Dispense:  6 tablet    Refill:  0    Order Specific Question:   Supervising Provider    AnswerCorey Harold  . predniSONE (DELTASONE) 20 MG tablet    Sig: Take 3 tablets (60 mg total) by mouth daily with breakfast for 3 days, THEN 2 tablets (40 mg total) daily with breakfast for 3 days, THEN 1 tablet (20 mg total) daily with breakfast for 3 days.    Dispense:  18 tablet    Refill:  0    Order Specific Question:   Supervising Provider    AnswerBlane Ohara [id number]  . HYDROcodone-homatropine (HYCODAN) 5-1.5 MG/5ML syrup    Sig: Take 5 mLs by mouth every 8 (eight) hours as needed for cough.    Dispense:  120 mL    Refill:  0    Order Specific Question:   Supervising Provider    AnswerCorey Harold    Follow Up:  In Person prn  Signed, Vickey Sages, PA-C  05/11/2020 10:58 AM    [facility] [place]

## 2020-05-15 ENCOUNTER — Other Ambulatory Visit: Payer: Self-pay | Admitting: Physician Assistant

## 2020-05-15 MED ORDER — CIPROFLOXACIN HCL 500 MG PO TABS: 500.0000 mg | ORAL_TABLET | Freq: Two times a day (BID) | ORAL | 0 refills | Status: DC

## 2020-05-17 DIAGNOSIS — H6523 Chronic serous otitis media, bilateral: Secondary | ICD-10-CM | POA: Diagnosis not present

## 2020-05-17 DIAGNOSIS — H9213 Otorrhea, bilateral: Secondary | ICD-10-CM | POA: Diagnosis not present

## 2020-06-08 DIAGNOSIS — M26621 Arthralgia of right temporomandibular joint: Secondary | ICD-10-CM | POA: Diagnosis not present

## 2020-06-08 DIAGNOSIS — R42 Dizziness and giddiness: Secondary | ICD-10-CM | POA: Diagnosis not present

## 2020-06-08 DIAGNOSIS — H938X3 Other specified disorders of ear, bilateral: Secondary | ICD-10-CM | POA: Diagnosis not present

## 2020-06-08 DIAGNOSIS — H9313 Tinnitus, bilateral: Secondary | ICD-10-CM | POA: Diagnosis not present

## 2020-06-08 DIAGNOSIS — R110 Nausea: Secondary | ICD-10-CM | POA: Diagnosis not present

## 2020-06-08 DIAGNOSIS — Z9622 Myringotomy tube(s) status: Secondary | ICD-10-CM | POA: Diagnosis not present

## 2020-06-08 DIAGNOSIS — Z720 Tobacco use: Secondary | ICD-10-CM | POA: Diagnosis not present

## 2020-06-08 DIAGNOSIS — H9193 Unspecified hearing loss, bilateral: Secondary | ICD-10-CM | POA: Diagnosis not present

## 2020-06-08 DIAGNOSIS — H7013 Chronic mastoiditis, bilateral: Secondary | ICD-10-CM | POA: Diagnosis not present

## 2020-06-08 DIAGNOSIS — R519 Headache, unspecified: Secondary | ICD-10-CM | POA: Diagnosis not present

## 2020-06-08 DIAGNOSIS — H9203 Otalgia, bilateral: Secondary | ICD-10-CM | POA: Diagnosis not present

## 2020-06-08 DIAGNOSIS — H663X3 Other chronic suppurative otitis media, bilateral: Secondary | ICD-10-CM | POA: Diagnosis not present

## 2020-06-08 DIAGNOSIS — H9213 Otorrhea, bilateral: Secondary | ICD-10-CM | POA: Diagnosis not present

## 2020-06-25 DIAGNOSIS — H748X1 Other specified disorders of right middle ear and mastoid: Secondary | ICD-10-CM | POA: Diagnosis not present

## 2020-06-25 DIAGNOSIS — H73893 Other specified disorders of tympanic membrane, bilateral: Secondary | ICD-10-CM | POA: Diagnosis not present

## 2020-06-26 DIAGNOSIS — Z01118 Encounter for examination of ears and hearing with other abnormal findings: Secondary | ICD-10-CM | POA: Diagnosis not present

## 2020-06-26 DIAGNOSIS — H6693 Otitis media, unspecified, bilateral: Secondary | ICD-10-CM | POA: Diagnosis not present

## 2020-06-26 DIAGNOSIS — H9313 Tinnitus, bilateral: Secondary | ICD-10-CM | POA: Diagnosis not present

## 2020-06-26 DIAGNOSIS — Z9622 Myringotomy tube(s) status: Secondary | ICD-10-CM | POA: Diagnosis not present

## 2020-06-26 DIAGNOSIS — H90A31 Mixed conductive and sensorineural hearing loss, unilateral, right ear with restricted hearing on the contralateral side: Secondary | ICD-10-CM | POA: Diagnosis not present

## 2020-06-26 DIAGNOSIS — H663X3 Other chronic suppurative otitis media, bilateral: Secondary | ICD-10-CM | POA: Diagnosis not present

## 2020-06-26 DIAGNOSIS — H90A11 Conductive hearing loss, unilateral, right ear with restricted hearing on the contralateral side: Secondary | ICD-10-CM | POA: Diagnosis not present

## 2020-06-26 DIAGNOSIS — H7013 Chronic mastoiditis, bilateral: Secondary | ICD-10-CM | POA: Diagnosis not present

## 2020-06-26 DIAGNOSIS — H90A22 Sensorineural hearing loss, unilateral, left ear, with restricted hearing on the contralateral side: Secondary | ICD-10-CM | POA: Diagnosis not present

## 2020-06-26 DIAGNOSIS — F17210 Nicotine dependence, cigarettes, uncomplicated: Secondary | ICD-10-CM | POA: Diagnosis not present

## 2020-06-26 DIAGNOSIS — H9193 Unspecified hearing loss, bilateral: Secondary | ICD-10-CM | POA: Diagnosis not present

## 2020-07-05 DIAGNOSIS — Z880 Allergy status to penicillin: Secondary | ICD-10-CM | POA: Insufficient documentation

## 2020-08-20 IMAGING — CT CT RENAL STONE PROTOCOL
2 of 3 series · 16 of 46 positions shown, 18 images · non-contrast
Comparison: None.

CLINICAL DATA: Right flank pain x3 days, hematuria

EXAM:
CT ABDOMEN AND PELVIS WITHOUT CONTRAST
TECHNIQUE: Multidetector CT imaging of the abdomen and pelvis was performed
following the standard protocol without IV contrast.

[Series 2: axial st · axial · 0.94mm/px · z∈[-468,-38]mm · 13 of 100 slices shown, 15 images]
[im 7/100  soft-tissue]
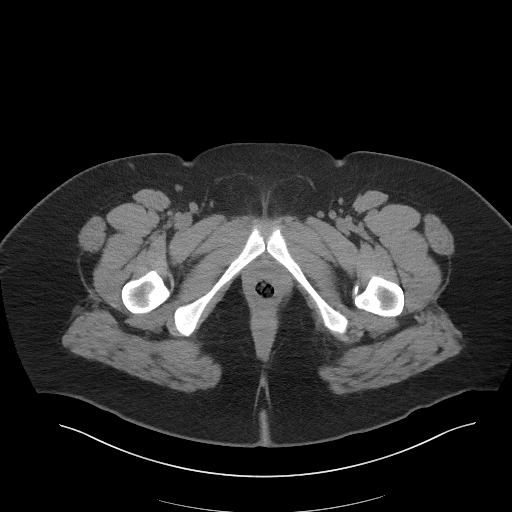
[im 7/100  bone]
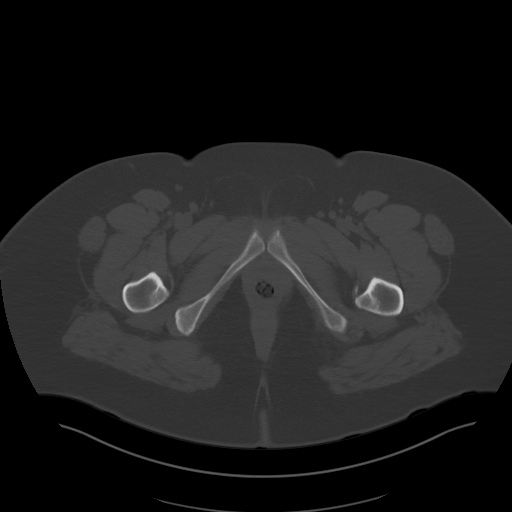
[im 13/100  soft-tissue]
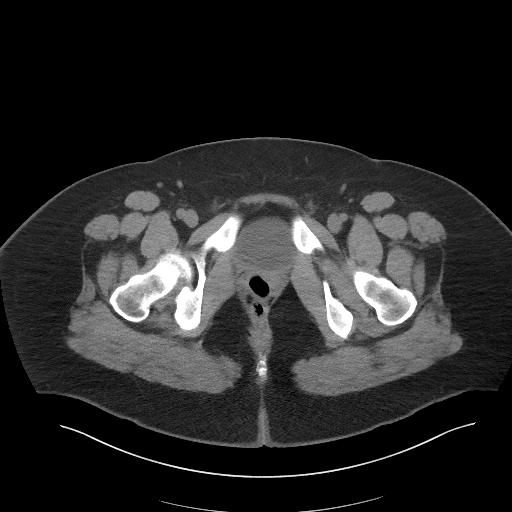
[im 20/100  soft-tissue]
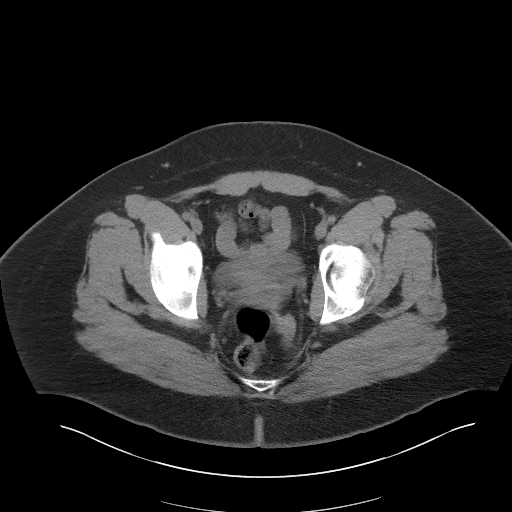
[im 29/100  soft-tissue]
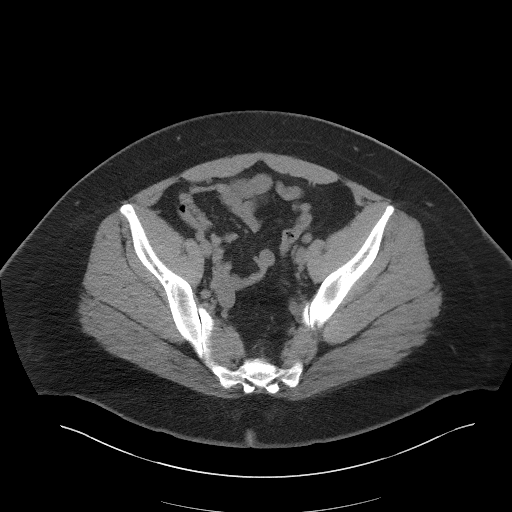
[im 36/100  soft-tissue]
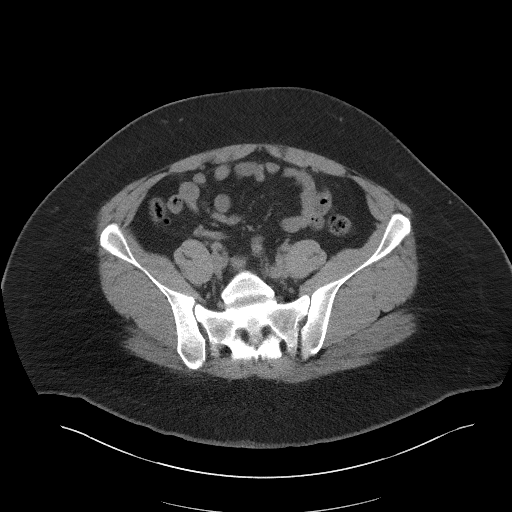
[im 42/100  soft-tissue]
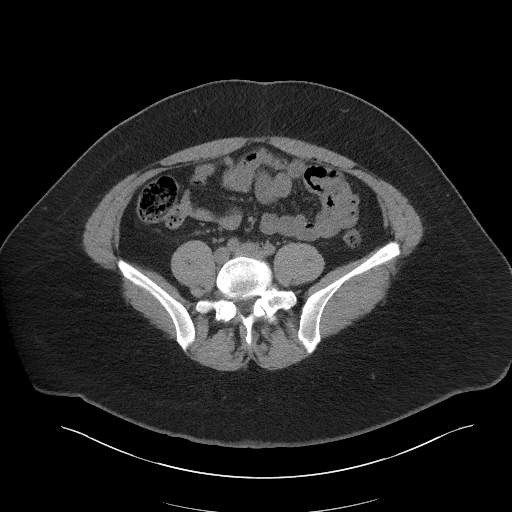
[im 52/100  soft-tissue]
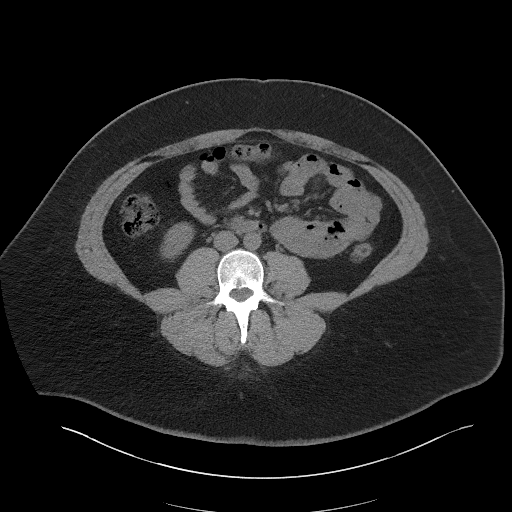
[im 58/100  soft-tissue]
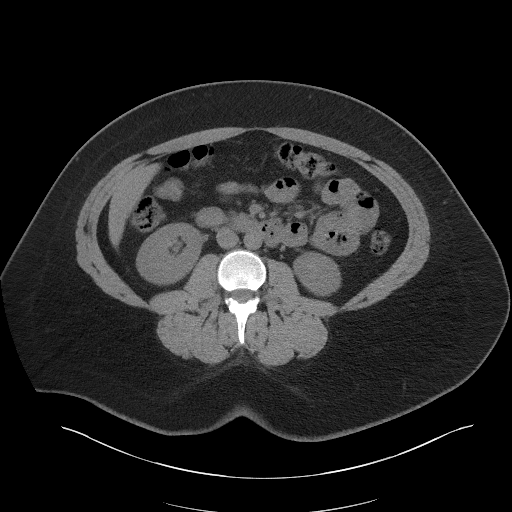
[im 64/100  soft-tissue]
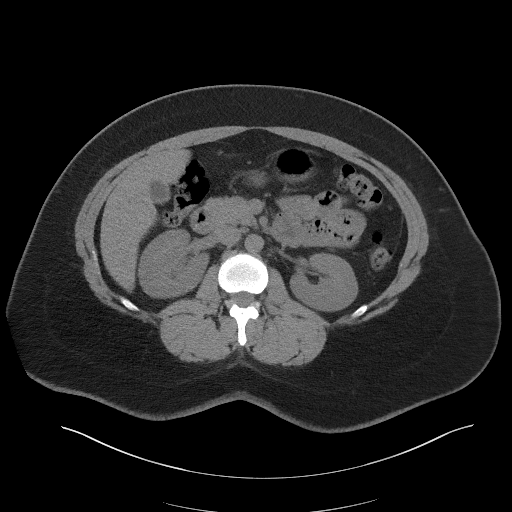
[im 64/100  bone]
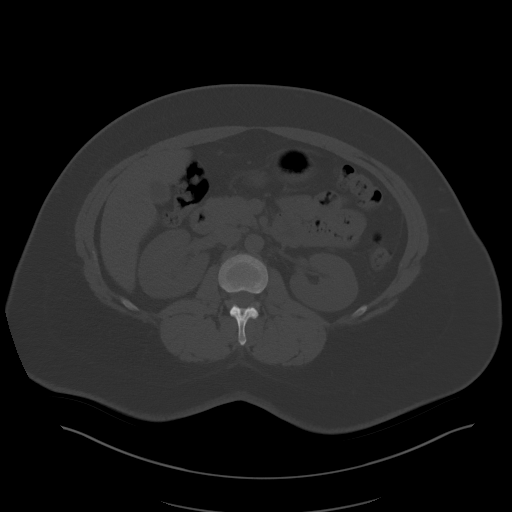
[im 71/100  soft-tissue]
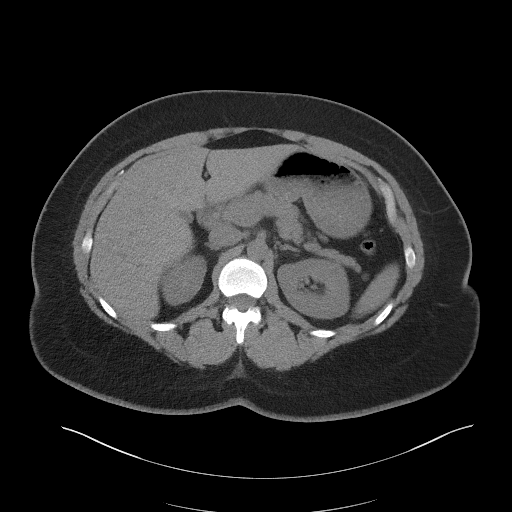
[im 80/100  soft-tissue]
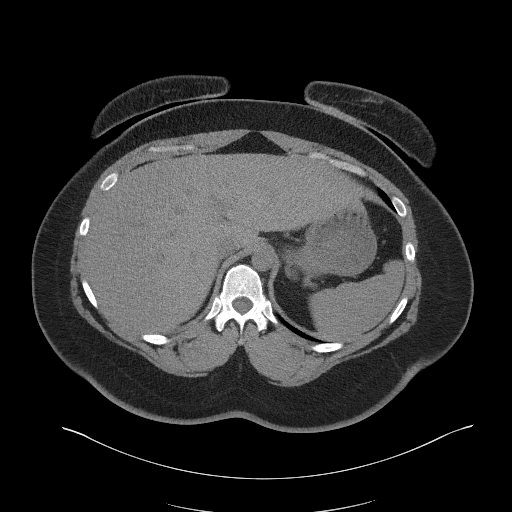
[im 87/100  soft-tissue]
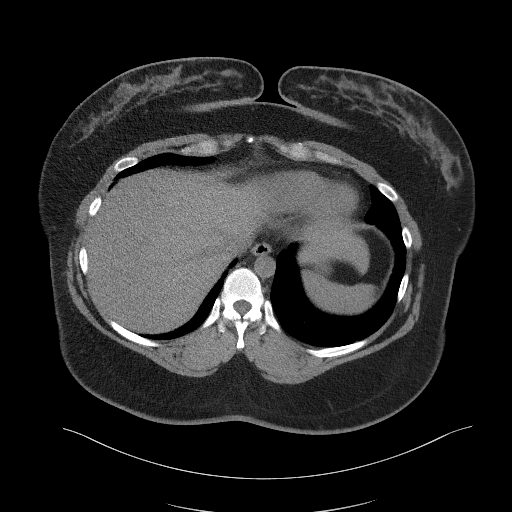
[im 93/100  soft-tissue]
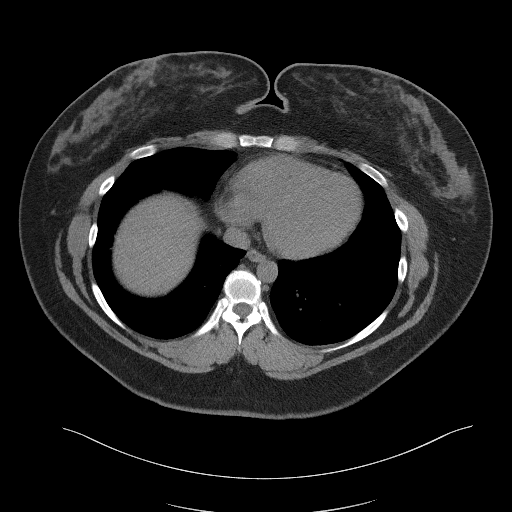

[Series 4: coronal st · coronal · 0.97mm/px · 3 of 101 slices shown]
[im 34/101  soft-tissue]
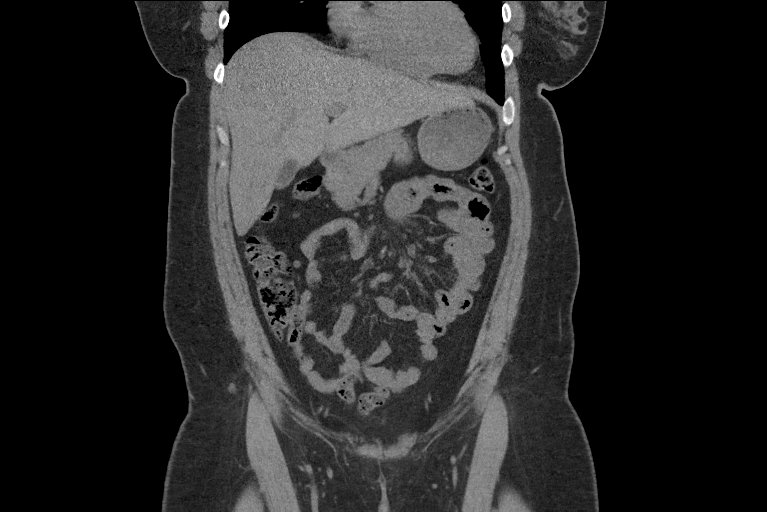
[im 45/101  soft-tissue]
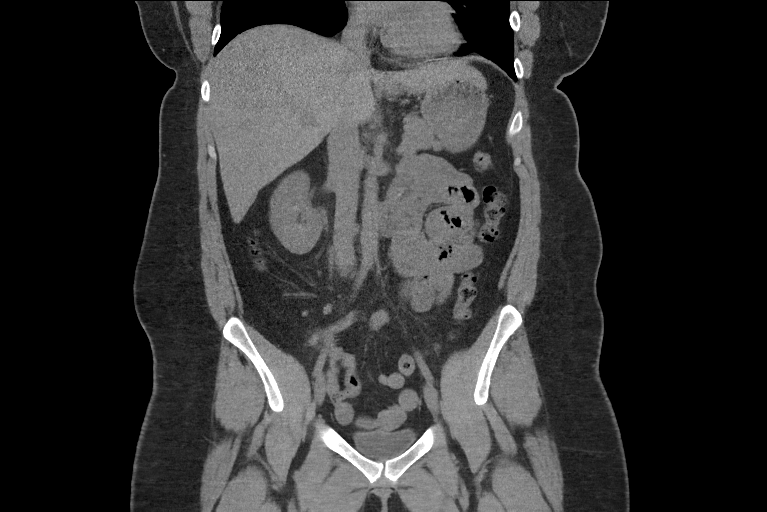
[im 56/101  soft-tissue]
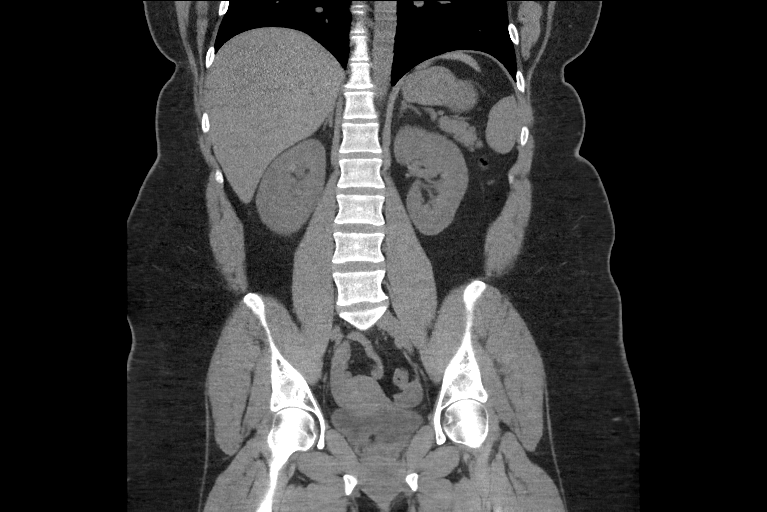

[16 of 46 positions shown; findings below may reference images not displayed]

FINDINGS: Lower chest: Lung bases are clear.

Hepatobiliary: Unenhanced liver is unremarkable.

Gallbladder is unremarkable. No intrahepatic or extrahepatic ductal
dilatation.

Pancreas: Within normal limits.

Spleen: Within normal limits.

Adrenals/Urinary Tract: Adrenal glands are within normal limits.

Kidneys are within normal limits. No renal, ureteral, or bladder
calculi. No hydronephrosis.

Bladder is underdistended but unremarkable.

Stomach/Bowel: Stomach is within normal limits.

No evidence of bowel obstruction.

Normal appendix (series 2/image 68), mildly prominent but without
inflammatory changes.

Vascular/Lymphatic: No evidence of abdominal aortic aneurysm.

No suspicious abdominopelvic lymphadenopathy.

Reproductive: Uterus is within normal limits.

Bilateral ovaries are within normal limits.

Other: No abdominopelvic ascites.

Musculoskeletal: Visualized osseous structures are within normal
limits.
IMPRESSION: Negative CT abdomen/pelvis.

## 2020-08-30 ENCOUNTER — Ambulatory Visit (INDEPENDENT_AMBULATORY_CARE_PROVIDER_SITE_OTHER): Payer: BC Managed Care – PPO | Admitting: Physician Assistant

## 2020-08-30 ENCOUNTER — Encounter: Payer: Self-pay | Admitting: Physician Assistant

## 2020-08-30 ENCOUNTER — Other Ambulatory Visit: Payer: Self-pay

## 2020-08-30 VITALS — BP 122/82 | HR 95 | Temp 97.5°F | Ht 69.0 in | Wt 307.0 lb

## 2020-08-30 DIAGNOSIS — R5383 Other fatigue: Secondary | ICD-10-CM

## 2020-08-30 MED ORDER — PHENTERMINE HCL 37.5 MG PO CAPS: 37.5000 mg | ORAL_CAPSULE | ORAL | 0 refills | Status: DC

## 2020-08-30 NOTE — Progress Notes (Signed)
Subjective:  Patient ID: Shannon Morgan, female    DOB: 01-23-1991  Age: 30 y.o. MRN: [id number]  Chief Complaint  Patient presents with  . Weight Check    HPI  pt complains of fatigue - has been going on for several months - thinks due to weight gain She has gained over 25 pounds in past year States she quit drinking soda 6 months ago - is going to gym 1-2 times weekly and doing cardio Drinking water as well Would like to restart adipex that she has used in past for weight loss No current outpatient medications on file prior to visit.   No current facility-administered medications on file prior to visit.   Past Medical History:  Diagnosis Date  . Bipolar disorder, current episode depressed, moderate (HCC)   . Essential (primary) hypertension   . Morbid (severe) obesity due to excess calories (HCC)   . Other hypertrophic disorders of the skin    Past Surgical History:  Procedure Laterality Date  . INNER EAR SURGERY    . KNEE ARTHROSCOPY    . KNEE SURGERY      Family History  Problem Relation Age of Onset  . Hypertension Mother   . Asthma Sister   . Arrhythmia Other   . Cerebrovascular Accident Other   . Asthma Other    Social History   Socioeconomic History  . Marital status: Single    Spouse name: Not on file  . Number of children: Not on file  . Years of education: Not on file  . Highest education level: Not on file  Occupational History  . Occupation: Barrister's clerk  Tobacco Use  . Smoking status: Current Every Day Smoker    Packs/day: 1.00    Types: Cigarettes  . Smokeless tobacco: Never Used  Vaping Use  . Vaping Use: Never used  Substance and Sexual Activity  . Alcohol use: No  . Drug use: No  . Sexual activity: Yes    Birth control/protection: None, Implant  Other Topics Concern  . Not on file  Social History Narrative  . Not on file   Social Determinants of Health   Financial Resource Strain: Not on file  Food Insecurity: Not on file   Transportation Needs: Not on file  Physical Activity: Not on file  Stress: Not on file  Social Connections: Not on file    Review of Systems CONSTITUTIONAL: see HPI E/N/T: Negative for ear pain, nasal congestion and sore throat.  CARDIOVASCULAR: Negative for chest pain, dizziness, palpitations and pedal edema.  RESPIRATORY: Negative for recent cough and dyspnea.  GASTROINTESTINAL: Negative for abdominal pain, acid reflux symptoms, constipation, diarrhea, nausea and vomiting.  PSYCHIATRIC: Negative for sleep disturbance and to question depression screen.  Negative for depression, negative for anhedonia.       Objective:  BP 122/82 (BP Location: Left Arm, Patient Position: Sitting, Cuff Size: Normal)   Pulse 95   Temp (!) 97.5 F (36.4 C) (Temporal)   Ht 5\' 9"  (1.753 m)   Wt (!) 307 lb (139.3 kg)   SpO2 99%   BMI 45.34 kg/m   BP/Weight 08/30/2020 05/11/2020 08/18/2019  Systolic BP 122 - 128  Diastolic BP 82 - 72  Wt. (Lbs) 307 276 286  BMI 45.34 42.59 44.13    Physical Exam PHYSICAL EXAM:   VS: BP 122/82 (BP Location: Left Arm, Patient Position: Sitting, Cuff Size: Normal)   Pulse 95   Temp (!) 97.5 F (36.4 C) (Temporal)   Ht  5\' 9"  (1.753 m)   Wt (!) 307 lb (139.3 kg)   SpO2 99%   BMI 45.34 kg/m   GEN: Well nourished, well developed, in no acute distress - morbidly obese Cardiac: RRR; no murmurs, rubs, or gallops,no edema -  Respiratory:  normal respiratory rate and pattern with no distress - normal breath sounds with no rales, rhonchi, wheezes or rubs Psych: euthymic mood, appropriate affect and demeanor  Diabetic Foot Exam - Simple   No data filed      Lab Results  Component Value Date   WBC 10.2 08/18/2019   HGB 14.3 08/18/2019   HCT 40.9 08/18/2019   PLT 454 (H) 08/18/2019   GLUCOSE 89 08/18/2019   ALT 23 08/18/2019   AST 21 08/18/2019   NA 139 08/18/2019   K 4.4 08/18/2019   CL 105 08/18/2019   CREATININE 0.73 08/18/2019   BUN 13 08/18/2019    CO2 20 08/18/2019   TSH 2.680 08/18/2019      Assessment & Plan:   1. Other fatigue - CBC with Differential/Platelet - Comprehensive metabolic panel - Thyroid Panel With TSH  2. Morbid obesity (HCC) - phentermine 37.5 MG capsule; Take 1 capsule (37.5 mg total) by mouth every morning.  Dispense: 30 capsule; Refill: 0    Meds ordered this encounter  Medications  . phentermine 37.5 MG capsule    Sig: Take 1 capsule (37.5 mg total) by mouth every morning.    Dispense:  30 capsule    Refill:  0    Order Specific Question:   Supervising Provider    Answer04/17/2021    Orders Placed This Encounter  Procedures  . CBC with Differential/Platelet  . Comprehensive metabolic panel  . Thyroid Panel With TSH     Follow-up: Return in about 4 weeks (around 09/27/2020) for recheck.  An After Visit Summary was printed and given to the patient.  09/29/2020 [facility] [phone]

## 2020-08-31 LAB — CBC WITH DIFFERENTIAL/PLATELET
Basophils Absolute: 0.1 10*3/uL (ref 0.0–0.2)
Basos: 1 %
EOS (ABSOLUTE): 0.2 10*3/uL (ref 0.0–0.4)
Eos: 2 %
Hematocrit: 40.7 % (ref 34.0–46.6)
Hemoglobin: 13.9 g/dL (ref 11.1–15.9)
Immature Grans (Abs): 0.1 10*3/uL (ref 0.0–0.1)
Immature Granulocytes: 1 %
Lymphocytes Absolute: 2.9 10*3/uL (ref 0.7–3.1)
Lymphs: 27 %
MCH: 31 pg (ref 26.6–33.0)
MCHC: 34.2 g/dL (ref 31.5–35.7)
MCV: 91 fL (ref 79–97)
Monocytes Absolute: 0.5 10*3/uL (ref 0.1–0.9)
Monocytes: 4 %
Neutrophils Absolute: 7 10*3/uL (ref 1.4–7.0)
Neutrophils: 65 %
Platelets: 428 10*3/uL (ref 150–450)
RBC: 4.48 x10E6/uL (ref 3.77–5.28)
RDW: 12.1 % (ref 11.7–15.4)
WBC: 10.7 10*3/uL (ref 3.4–10.8)

## 2020-08-31 LAB — COMPREHENSIVE METABOLIC PANEL
ALT: 29 IU/L (ref 0–32)
AST: 24 IU/L (ref 0–40)
Albumin/Globulin Ratio: 2.2 (ref 1.2–2.2)
Albumin: 4.9 g/dL (ref 3.9–5.0)
Alkaline Phosphatase: 112 IU/L (ref 44–121)
BUN/Creatinine Ratio: 18 (ref 9–23)
BUN: 14 mg/dL (ref 6–20)
Bilirubin Total: 0.3 mg/dL (ref 0.0–1.2)
CO2: 18 mmol/L — ABNORMAL LOW (ref 20–29)
Calcium: 9.7 mg/dL (ref 8.7–10.2)
Chloride: 98 mmol/L (ref 96–106)
Creatinine, Ser: 0.77 mg/dL (ref 0.57–1.00)
Globulin, Total: 2.2 g/dL (ref 1.5–4.5)
Glucose: 114 mg/dL — ABNORMAL HIGH (ref 65–99)
Potassium: 4.1 mmol/L (ref 3.5–5.2)
Sodium: 136 mmol/L (ref 134–144)
Total Protein: 7.1 g/dL (ref 6.0–8.5)
eGFR: 108 mL/min/{1.73_m2} (ref >59)

## 2020-08-31 LAB — THYROID PANEL WITH TSH
Free Thyroxine Index: 2.3 (ref 1.2–4.9)
T3 Uptake Ratio: 26 % (ref 24–39)
T4, Total: 8.7 ug/dL (ref 4.5–12.0)
TSH: 3.74 u[IU]/mL (ref 0.450–4.500)

## 2020-09-27 ENCOUNTER — Ambulatory Visit: Payer: BC Managed Care – PPO | Admitting: Physician Assistant

## 2020-09-27 ENCOUNTER — Telehealth: Payer: Self-pay

## 2020-09-27 NOTE — Telephone Encounter (Signed)
I tried to contact the pt this morning before her appointment today about her EMDS/EPIC balance. Call could not be completed at this time.

## 2020-11-18 DIAGNOSIS — M79662 Pain in left lower leg: Secondary | ICD-10-CM | POA: Diagnosis not present

## 2020-11-21 ENCOUNTER — Encounter: Payer: Self-pay | Admitting: Physician Assistant

## 2020-12-07 ENCOUNTER — Telehealth: Payer: Self-pay

## 2020-12-07 NOTE — Telephone Encounter (Signed)
Patient called left a message about wanting provider to call in rx for epi pen, Stated she had an reaction to something she ate last night had to drink a hold bottle of kids benadryl.   Spoke with provider Patient needs to see allergist, so they can do test and find out what she is allergenic to.  Patient will call and make an appointment for referral if her insurance require a referral to allergist.

## 2020-12-17 DIAGNOSIS — J209 Acute bronchitis, unspecified: Secondary | ICD-10-CM | POA: Diagnosis not present

## 2021-01-23 ENCOUNTER — Ambulatory Visit: Payer: BC Managed Care – PPO | Admitting: Physician Assistant

## 2021-03-26 DIAGNOSIS — H9209 Otalgia, unspecified ear: Secondary | ICD-10-CM | POA: Diagnosis not present

## 2021-03-26 DIAGNOSIS — J029 Acute pharyngitis, unspecified: Secondary | ICD-10-CM | POA: Diagnosis not present

## 2021-03-26 DIAGNOSIS — H66002 Acute suppurative otitis media without spontaneous rupture of ear drum, left ear: Secondary | ICD-10-CM | POA: Diagnosis not present

## 2021-03-26 DIAGNOSIS — B370 Candidal stomatitis: Secondary | ICD-10-CM | POA: Diagnosis not present

## 2021-05-07 DIAGNOSIS — R0982 Postnasal drip: Secondary | ICD-10-CM | POA: Diagnosis not present

## 2021-05-07 DIAGNOSIS — Z20822 Contact with and (suspected) exposure to covid-19: Secondary | ICD-10-CM | POA: Diagnosis not present

## 2021-05-07 DIAGNOSIS — R051 Acute cough: Secondary | ICD-10-CM | POA: Diagnosis not present

## 2021-07-18 ENCOUNTER — Telehealth: Payer: Self-pay

## 2021-07-18 NOTE — Telephone Encounter (Signed)
[place], mother transferred from front desk to triage. Mother states patient has been vomiting and lost 12 pounds since Monday. Requesting an appointment. Mother is not with patient and in different state. Obtained contact number for patient ([phone]), called patient.  ? ?Patient has been vomiting, unable to keep any fluids down since Sunday. She has lost 11-12 pounds. She complains of weakness and fatigue. Advised patient proceed to ED. She will reach out for driver, if she is unable to obtain a driver she will call EMS for transport.  ? ?Terrill Mohr 07/18/21 2:00 PM ? ?

## 2021-07-19 DIAGNOSIS — R112 Nausea with vomiting, unspecified: Secondary | ICD-10-CM | POA: Diagnosis not present

## 2021-07-19 DIAGNOSIS — E860 Dehydration: Secondary | ICD-10-CM | POA: Diagnosis not present

## 2021-07-19 DIAGNOSIS — R197 Diarrhea, unspecified: Secondary | ICD-10-CM | POA: Diagnosis not present

## 2021-08-02 ENCOUNTER — Ambulatory Visit (INDEPENDENT_AMBULATORY_CARE_PROVIDER_SITE_OTHER): Payer: BLUE CROSS/BLUE SHIELD | Admitting: Physician Assistant

## 2021-08-02 ENCOUNTER — Encounter: Payer: Self-pay | Admitting: Physician Assistant

## 2021-08-02 VITALS — BP 112/72 | HR 74 | Temp 98.5°F | Ht 69.0 in | Wt 285.0 lb

## 2021-08-02 DIAGNOSIS — R5383 Other fatigue: Secondary | ICD-10-CM

## 2021-08-02 DIAGNOSIS — N6489 Other specified disorders of breast: Secondary | ICD-10-CM | POA: Diagnosis not present

## 2021-08-02 MED ORDER — WEGOVY 0.25 MG/0.5ML ~~LOC~~ SOAJ: 0.2500 mg | SUBCUTANEOUS | 0 refills | Status: DC

## 2021-08-05 ENCOUNTER — Encounter: Payer: Self-pay | Admitting: Physician Assistant

## 2021-08-06 ENCOUNTER — Telehealth: Payer: Self-pay

## 2021-08-06 ENCOUNTER — Encounter: Payer: Self-pay | Admitting: Physician Assistant

## 2021-08-06 ENCOUNTER — Other Ambulatory Visit: Payer: Self-pay | Admitting: Physician Assistant

## 2021-08-06 MED ORDER — PHENTERMINE HCL 37.5 MG PO CAPS: 37.5000 mg | ORAL_CAPSULE | ORAL | 0 refills | Status: DC

## 2021-08-06 NOTE — Telephone Encounter (Signed)
Error

## 2021-08-06 NOTE — Telephone Encounter (Signed)
Spoke with patient, verbalized understanding and had no questions at this time. ?Coming in May 5th at 10am for follow up. ?

## 2021-08-06 NOTE — Telephone Encounter (Signed)
Patient called stating her insurance does not cover wegovy, can you send phentermine again? ?

## 2021-08-06 NOTE — Telephone Encounter (Signed)
Will send in med ?Have pt make follow up appt in one month ?

## 2021-08-06 NOTE — Telephone Encounter (Signed)
Attempted to call patient to make her aware weight loss medications are not covered under insurance. Therefore, Reginal Lutes has been denied. Per Kennon Rounds, patient can try phentermine again.  ? ?Terrill Mohr 08/06/21 11:23 AM ? ?

## 2021-08-06 NOTE — Progress Notes (Signed)
? ?Subjective:  ?Patient ID: Shannon Morgan, female    DOB: 1990/12/03  Age: 31 y.o. MRN: [id number] ? ?Chief Complaint  ?Patient presents with  ? DISCUSS WT LOSS  ? ? ?HPI ? Pt would like to be referred to plastic surgeon for evaluation of breast reduction surgery - states that she has breast pain, back pain and shoulder pain all because of issues with heavy and pendulous breasts ? ?Pt would like to discuss weight loss options - has been trying to watch diet and efforts at activity for physical exercise - has been on adipex in the past and did well but would like to see if Reginal Lutes is covered by insurance ? ?Pt has had mild fatigue symptoms as well ?No current outpatient medications on file prior to visit.  ? ?No current facility-administered medications on file prior to visit.  ? ?Past Medical History:  ?Diagnosis Date  ? Bipolar disorder, current episode depressed, moderate (HCC)   ? Essential (primary) hypertension   ? Morbid (severe) obesity due to excess calories (HCC)   ? Other hypertrophic disorders of the skin   ? ?Past Surgical History:  ?Procedure Laterality Date  ? INNER EAR SURGERY    ? KNEE ARTHROSCOPY    ? KNEE SURGERY    ?  ?Family History  ?Problem Relation Age of Onset  ? Hypertension Mother   ? Asthma Sister   ? Arrhythmia Other   ? Cerebrovascular Accident Other   ? Asthma Other   ? ?Social History  ? ?Socioeconomic History  ? Marital status: Single  ?  Spouse name: Not on file  ? Number of children: Not on file  ? Years of education: Not on file  ? Highest education level: Not on file  ?Occupational History  ? Occupation: Barrister's clerk  ?Tobacco Use  ? Smoking status: Every Day  ?  Packs/day: 1.00  ?  Types: Cigarettes  ? Smokeless tobacco: Never  ?Vaping Use  ? Vaping Use: Never used  ?Substance and Sexual Activity  ? Alcohol use: No  ? Drug use: No  ? Sexual activity: Yes  ?  Birth control/protection: None, Implant  ?Other Topics Concern  ? Not on file  ?Social History Narrative  ? ** Merged  History Encounter **  ?    ? ?Social Determinants of Health  ? ?Financial Resource Strain: Not on file  ?Food Insecurity: Not on file  ?Transportation Needs: Not on file  ?Physical Activity: Not on file  ?Stress: Not on file  ?Social Connections: Not on file  ? ? ?Review of Systems ?CONSTITUTIONAL: see HPI ?CARDIOVASCULAR: Negative for chest pain, dizziness, palpitations and pedal edema.  ?RESPIRATORY: Negative for recent cough and dyspnea.  ?GASTROINTESTINAL: Negative for abdominal pain, acid reflux symptoms, constipation, diarrhea, nausea and vomiting.  ?INTEGUMENTARY: Negative for rash.  ? ? ?Objective:  ?BP 112/72 (BP Location: Left Arm, Patient Position: Sitting)   Pulse 74   Temp 98.5 ?F (36.9 ?C) (Oral)   Ht 5\' 9"  (1.753 m)   Wt 285 lb (129.3 kg)   SpO2 98%   BMI 42.09 kg/m?  ? ? ?  08/02/2021  ? 11:32 AM 08/30/2020  ?  3:22 PM 05/11/2020  ? 10:54 AM  ?BP/Weight  ?Systolic BP 112 122   ?Diastolic BP 72 82   ?Wt. (Lbs) 285 307 276  ?BMI 42.09 kg/m2 45.34 kg/m2 42.59 kg/m2  ? ? ?Physical Exam ?PHYSICAL EXAM:  ? ?VS: BP 112/72 (BP Location: Left Arm, Patient Position: Sitting)  Pulse 74   Temp 98.5 ?F (36.9 ?C) (Oral)   Ht 5\' 9"  (1.753 m)   Wt 285 lb (129.3 kg)   SpO2 98%   BMI 42.09 kg/m?  ? ?GEN: Well nourished, well developed, in no acute distress  ?Cardiac: RRR; no murmurs,  ?Respiratory:  normal respiratory rate and pattern with no distress - normal breath sounds with no rales, rhonchi, wheezes or rubs ?Skin: warm and dry, no rash  ? ?Psych: euthymic mood, appropriate affect and demeanor ? ?Diabetic Foot Exam - Simple   ?No data filed ?  ?  ? ?Lab Results  ?Component Value Date  ? WBC 10.7 08/30/2020  ? HGB 13.9 08/30/2020  ? HCT 40.7 08/30/2020  ? PLT 428 08/30/2020  ? GLUCOSE 114 (H) 08/30/2020  ? ALT 29 08/30/2020  ? AST 24 08/30/2020  ? NA 136 08/30/2020  ? K 4.1 08/30/2020  ? CL 98 08/30/2020  ? CREATININE 0.77 08/30/2020  ? BUN 14 08/30/2020  ? CO2 18 (L) 08/30/2020  ? TSH 3.740 08/30/2020   ? ? ? ? ?Assessment & Plan:  ? ?Problem List Items Addressed This Visit   ? ?  ? Other  ? Other fatigue - Primary  ? Relevant Orders  ? CBC with Differential/Platelet  ? Comprehensive metabolic panel  ? TSH  ? Morbid obesity ([place])  ? Relevant Medications  ? Semaglutide-Weight Management (WEGOVY) 0.25 MG/0.5ML SOAJ  ? ?Other Visit Diagnoses   ? ? Bilateral pendulous breasts      ? Relevant Orders  ? Ambulatory referral to Plastic Surgery  ? ?  ?. ? ?Meds ordered this encounter  ?Medications  ? Semaglutide-Weight Management (WEGOVY) 0.25 MG/0.5ML SOAJ  ?  Sig: Inject 0.25 mg into the skin once a week.  ?  Dispense:  2 mL  ?  Refill:  0  ?  Order Specific Question:   Supervising Provider  ?  AnswerRochel Brome [id number]  ? ? ?Orders Placed This Encounter  ?Procedures  ? CBC with Differential/Platelet  ? Comprehensive metabolic panel  ? TSH  ? Ambulatory referral to Plastic Surgery  ?  ? ?Follow-up: Return in about 2 months (around 10/02/2021) for follow up. ? ?An After Visit Summary was printed and given to the patient. ? ?SARA R Shadae Reino, PA-C ?[place] ?[phone]

## 2021-08-08 DIAGNOSIS — Z03818 Encounter for observation for suspected exposure to other biological agents ruled out: Secondary | ICD-10-CM | POA: Diagnosis not present

## 2021-08-08 DIAGNOSIS — Z20822 Contact with and (suspected) exposure to covid-19: Secondary | ICD-10-CM | POA: Diagnosis not present

## 2021-08-20 DIAGNOSIS — N62 Hypertrophy of breast: Secondary | ICD-10-CM | POA: Diagnosis not present

## 2021-09-06 ENCOUNTER — Ambulatory Visit: Payer: BLUE CROSS/BLUE SHIELD | Admitting: Physician Assistant

## 2021-09-13 DIAGNOSIS — H6120 Impacted cerumen, unspecified ear: Secondary | ICD-10-CM | POA: Diagnosis not present

## 2021-09-13 DIAGNOSIS — H90A31 Mixed conductive and sensorineural hearing loss, unilateral, right ear with restricted hearing on the contralateral side: Secondary | ICD-10-CM | POA: Insufficient documentation

## 2021-09-13 DIAGNOSIS — H9212 Otorrhea, left ear: Secondary | ICD-10-CM | POA: Diagnosis not present

## 2021-09-13 DIAGNOSIS — H663X3 Other chronic suppurative otitis media, bilateral: Secondary | ICD-10-CM | POA: Diagnosis not present

## 2021-09-13 DIAGNOSIS — H7013 Chronic mastoiditis, bilateral: Secondary | ICD-10-CM | POA: Diagnosis not present

## 2021-09-13 DIAGNOSIS — Z9622 Myringotomy tube(s) status: Secondary | ICD-10-CM | POA: Diagnosis not present

## 2021-09-13 DIAGNOSIS — H73893 Other specified disorders of tympanic membrane, bilateral: Secondary | ICD-10-CM | POA: Diagnosis not present

## 2021-09-13 DIAGNOSIS — H6121 Impacted cerumen, right ear: Secondary | ICD-10-CM | POA: Diagnosis not present

## 2021-09-13 DIAGNOSIS — H61001 Unspecified perichondritis of right external ear: Secondary | ICD-10-CM | POA: Diagnosis not present

## 2021-09-13 DIAGNOSIS — F17200 Nicotine dependence, unspecified, uncomplicated: Secondary | ICD-10-CM | POA: Diagnosis not present

## 2021-09-13 DIAGNOSIS — L929 Granulomatous disorder of the skin and subcutaneous tissue, unspecified: Secondary | ICD-10-CM | POA: Diagnosis not present

## 2021-09-13 DIAGNOSIS — H7291 Unspecified perforation of tympanic membrane, right ear: Secondary | ICD-10-CM | POA: Diagnosis not present

## 2021-10-21 ENCOUNTER — Encounter: Payer: Self-pay | Admitting: Physician Assistant

## 2021-10-21 DIAGNOSIS — J019 Acute sinusitis, unspecified: Secondary | ICD-10-CM | POA: Diagnosis not present

## 2022-01-01 DIAGNOSIS — M79641 Pain in right hand: Secondary | ICD-10-CM | POA: Diagnosis not present

## 2022-05-13 DIAGNOSIS — M791 Myalgia, unspecified site: Secondary | ICD-10-CM | POA: Diagnosis not present

## 2022-05-13 DIAGNOSIS — H6692 Otitis media, unspecified, left ear: Secondary | ICD-10-CM | POA: Diagnosis not present

## 2022-05-13 DIAGNOSIS — R059 Cough, unspecified: Secondary | ICD-10-CM | POA: Diagnosis not present

## 2022-05-13 DIAGNOSIS — J019 Acute sinusitis, unspecified: Secondary | ICD-10-CM | POA: Diagnosis not present

## 2022-11-25 ENCOUNTER — Ambulatory Visit (INDEPENDENT_AMBULATORY_CARE_PROVIDER_SITE_OTHER): Payer: Medicaid Other | Admitting: Physician Assistant

## 2022-11-25 ENCOUNTER — Encounter: Payer: Self-pay | Admitting: Physician Assistant

## 2022-11-25 VITALS — BP 110/70 | HR 82 | Temp 97.7°F | Ht 69.0 in | Wt 251.8 lb

## 2022-11-25 DIAGNOSIS — N644 Mastodynia: Secondary | ICD-10-CM

## 2022-11-25 DIAGNOSIS — R5383 Other fatigue: Secondary | ICD-10-CM | POA: Diagnosis not present

## 2022-11-25 DIAGNOSIS — Z83438 Family history of other disorder of lipoprotein metabolism and other lipidemia: Secondary | ICD-10-CM | POA: Diagnosis not present

## 2022-11-25 DIAGNOSIS — Z6837 Body mass index (BMI) 37.0-37.9, adult: Secondary | ICD-10-CM

## 2022-11-25 MED ORDER — ZEPBOUND 2.5 MG/0.5ML ~~LOC~~ SOAJ: 2.5000 mg | SUBCUTANEOUS | 0 refills | Status: DC

## 2022-11-25 NOTE — Progress Notes (Signed)
Subjective:  Patient ID: Shannon Morgan, female    DOB: Jun 05, 1990  Age: 32 y.o. MRN: [id number]  Chief Complaint  Patient presents with   Medical Management of Chronic Issues    HPI Pt with complaints of bilateral breast pain.  She has lost about 60 pounds over the past several months and states that both breasts seem painful at times.  She also has 'knots' that come and go in breasts that are painful.  She does not drink a lot of caffeine.  She has never had a mammogram  Pt would like labwork done today - has a family history of hyperlipidemia - would like that checked Has mild intermittent fatigue also -    Pt would like rx for weight loss medication - she had been on adipex in the past but now has insurance and would like to try zepbound     11/25/2022    8:13 AM  Depression screen PHQ 2/9  Decreased Interest 0  Down, Depressed, Hopeless 0  PHQ - 2 Score 0        11/25/2022    8:14 AM  Fall Risk  Falls in the past year? 0  Was there an injury with Fall? 0  Fall Risk Category Calculator 0  Patient at Risk for Falls Due to No Fall Risks  Fall risk Follow up Falls evaluation completed     ROS CONSTITUTIONAL: see HPI E/N/T: Negative for ear pain, nasal congestion and sore throat.  CARDIOVASCULAR: Negative for chest pain, dizziness, palpitations and pedal edema.  RESPIRATORY: Negative for recent cough and dyspnea.  Breasts - see HPI GASTROINTESTINAL: Negative for abdominal pain, acid reflux symptoms, constipation, diarrhea, nausea and vomiting.  MSK: Negative for arthralgias and myalgias.     Current Outpatient Medications:    tirzepatide (ZEPBOUND) 2.5 MG/0.5ML Pen, Inject 2.5 mg into the skin once a week., Disp: 2 mL, Rfl: 0  Past Medical History:  Diagnosis Date   Bipolar disorder, current episode depressed, moderate (HCC)    Essential (primary) hypertension    Morbid (severe) obesity due to excess calories (HCC)    Other hypertrophic disorders of the skin     Objective:  PHYSICAL EXAM:   BP 110/70 (BP Location: Left Arm, Patient Position: Sitting, Cuff Size: Large)   Pulse 82   Temp 97.7 F (36.5 C) (Temporal)   Ht 5\' 9"  (1.753 m)   Wt 251 lb 12.8 oz (114.2 kg)   SpO2 99%   BMI 37.18 kg/m    GEN: Well nourished, well developed, in no acute distress  Cardiac: RRR; no murmurs, rubs, or gallops,no edema - Respiratory:  normal respiratory rate and pattern with no distress - normal breath sounds with no rales, rhonchi, wheezes or rubs Skin: warm and dry, no rash  Psych: euthymic mood, appropriate affect and demeanor  Assessment & Plan:    Other fatigue -     CBC with Differential/Platelet -     Comprehensive metabolic panel -     TSH  Morbid obesity (HCC) -     Zepbound; Inject 2.5 mg into the skin once a week.  Dispense: 2 mL; Refill: 0 Watch diet Breast pain -     MM Digital Diagnostic Bilat; Future  Family history of hyperlipidemia -     Lipid panel     Follow-up: Return in about 2 months (around 01/26/2023) for follow-up.  An After Visit Summary was printed and given to the patient.  SARA R Tavarious Freel, PA-C [facility] [phone]

## 2022-11-26 LAB — COMPREHENSIVE METABOLIC PANEL
ALT: 10 IU/L (ref 0–32)
AST: 13 IU/L (ref 0–40)
Albumin: 4.5 g/dL (ref 3.9–4.9)
Alkaline Phosphatase: 85 IU/L (ref 44–121)
BUN/Creatinine Ratio: 27 — ABNORMAL HIGH (ref 9–23)
BUN: 20 mg/dL (ref 6–20)
Bilirubin Total: 0.3 mg/dL (ref 0.0–1.2)
CO2: 21 mmol/L (ref 20–29)
Calcium: 9.9 mg/dL (ref 8.7–10.2)
Chloride: 104 mmol/L (ref 96–106)
Creatinine, Ser: 0.75 mg/dL (ref 0.57–1.00)
Globulin, Total: 2.6 g/dL (ref 1.5–4.5)
Glucose: 89 mg/dL (ref 70–99)
Potassium: 4.7 mmol/L (ref 3.5–5.2)
Sodium: 141 mmol/L (ref 134–144)
Total Protein: 7.1 g/dL (ref 6.0–8.5)
eGFR: 109 mL/min/{1.73_m2} (ref >59)

## 2022-11-26 LAB — CBC WITH DIFFERENTIAL/PLATELET
Basophils Absolute: 0 10*3/uL (ref 0.0–0.2)
Basos: 1 %
EOS (ABSOLUTE): 0.1 10*3/uL (ref 0.0–0.4)
Eos: 1 %
Hematocrit: 39.3 % (ref 34.0–46.6)
Hemoglobin: 13.2 g/dL (ref 11.1–15.9)
Immature Grans (Abs): 0 10*3/uL (ref 0.0–0.1)
Immature Granulocytes: 1 %
Lymphocytes Absolute: 2.9 10*3/uL (ref 0.7–3.1)
Lymphs: 35 %
MCH: 31 pg (ref 26.6–33.0)
MCHC: 33.6 g/dL (ref 31.5–35.7)
MCV: 92 fL (ref 79–97)
Monocytes Absolute: 0.5 10*3/uL (ref 0.1–0.9)
Monocytes: 5 %
Neutrophils Absolute: 4.8 10*3/uL (ref 1.4–7.0)
Neutrophils: 57 %
Platelets: 476 10*3/uL — ABNORMAL HIGH (ref 150–450)
RBC: 4.26 x10E6/uL (ref 3.77–5.28)
RDW: 12.3 % (ref 11.7–15.4)
WBC: 8.3 10*3/uL (ref 3.4–10.8)

## 2022-11-26 LAB — LIPID PANEL
Chol/HDL Ratio: 3.6 ratio (ref 0.0–4.4)
Cholesterol, Total: 141 mg/dL (ref 100–199)
HDL: 39 mg/dL — ABNORMAL LOW (ref >39)
LDL Chol Calc (NIH): 81 mg/dL (ref 0–99)
Triglycerides: 114 mg/dL (ref 0–149)
VLDL Cholesterol Cal: 21 mg/dL (ref 5–40)

## 2022-11-26 LAB — TSH: TSH: 2.95 u[IU]/mL (ref 0.450–4.500)

## 2022-11-28 ENCOUNTER — Other Ambulatory Visit: Payer: Self-pay

## 2022-11-28 ENCOUNTER — Telehealth: Payer: Self-pay | Admitting: Physician Assistant

## 2022-11-28 DIAGNOSIS — N644 Mastodynia: Secondary | ICD-10-CM

## 2022-11-28 NOTE — Telephone Encounter (Signed)
   Shannon Morgan has been scheduled for the following appointment:  WHAT: diagnostic mammogram WHERE: [place] outpatient DATE: 12/18/22 TIME: 8:30 AM Check-In  A message has been left for the patient.

## 2022-12-01 ENCOUNTER — Other Ambulatory Visit: Payer: Self-pay | Admitting: Physician Assistant

## 2022-12-01 ENCOUNTER — Telehealth: Payer: Self-pay

## 2022-12-01 MED ORDER — PHENTERMINE HCL 37.5 MG PO CAPS: 37.5000 mg | ORAL_CAPSULE | ORAL | 1 refills | Status: DC

## 2022-12-01 NOTE — Telephone Encounter (Signed)
Will send in rx for phentermine -- please schedule pt for 2 months follow up

## 2022-12-01 NOTE — Telephone Encounter (Signed)
Patient called stating that her insurance denied the wt loss medications we were trying to get approved, she stated that she was told to call and let you know if they are were not approved and that you would send in Phentermine. She states that she uses [facility], Please advise.

## 2022-12-02 NOTE — Telephone Encounter (Signed)
Called patient and informed her that she would need to schedule a 2 month follow up and she stated that when she was here she had already scheduled a 2 month follow up.

## 2023-01-27 ENCOUNTER — Ambulatory Visit: Payer: Medicaid Other | Admitting: Physician Assistant

## 2023-03-26 ENCOUNTER — Ambulatory Visit: Payer: Medicaid Other | Admitting: Physician Assistant

## 2023-03-26 ENCOUNTER — Encounter: Payer: Self-pay | Admitting: Physician Assistant

## 2023-03-26 VITALS — BP 118/82 | HR 85 | Temp 97.2°F | Ht 69.0 in | Wt 253.0 lb

## 2023-03-26 DIAGNOSIS — K120 Recurrent oral aphthae: Secondary | ICD-10-CM

## 2023-03-26 DIAGNOSIS — F5101 Primary insomnia: Secondary | ICD-10-CM

## 2023-03-26 MED ORDER — TRAZODONE HCL 50 MG PO TABS: 50.0000 mg | ORAL_TABLET | Freq: Every day | ORAL | 1 refills | Status: DC

## 2023-03-26 MED ORDER — LIDOCAINE VISCOUS HCL 2 % MT SOLN: 5.0000 mL | Freq: Three times a day (TID) | OROMUCOSAL | 0 refills | Status: DC | PRN

## 2023-03-26 MED ORDER — TIRZEPATIDE-WEIGHT MANAGEMENT 2.5 MG/0.5ML ~~LOC~~ SOLN: 2.5000 mg | SUBCUTANEOUS | 0 refills | Status: DC

## 2023-03-26 NOTE — Progress Notes (Signed)
Subjective:  Patient ID: Shannon Morgan, female    DOB: 10-18-1990  Age: 32 y.o. MRN: [id number]  Chief Complaint  Patient presents with   Not sleeping at night time    HPI Pt states she is having trouble sleeping at night.  She thought was due to phentermine but even when not taking she feels very restless and anxious at night with trouble 'turning off thoughts' - she has tried several otc methods including tylenol pm and melatonin  Pt currently on phentermine for weight loss but now has MCD and would like to try injectible medication Zepbound - she has tried wegovy in past and could not tolerate She is trying to watch diet and exercise  Pt requests rx for magic mouthwash for when she gets apthous ulcers in mouth     03/26/2023   10:33 AM 11/25/2022    8:13 AM  Depression screen PHQ 2/9  Decreased Interest 0 0  Down, Depressed, Hopeless 0 0  PHQ - 2 Score 0 0  Altered sleeping 3   Tired, decreased energy 1   Change in appetite 0   Feeling bad or failure about yourself  0   Trouble concentrating 0   Moving slowly or fidgety/restless 0   Suicidal thoughts 0   PHQ-9 Score 4   Difficult doing work/chores Not difficult at all         11/25/2022    8:14 AM 03/26/2023   10:33 AM  Fall Risk  Falls in the past year? 0 0  Was there an injury with Fall? 0 0  Fall Risk Category Calculator 0 0  Patient at Risk for Falls Due to No Fall Risks No Fall Risks  Fall risk Follow up Falls evaluation completed Falls evaluation completed     ROS CONSTITUTIONAL: Negative for chills, fatigue, fever,  CARDIOVASCULAR: Negative for chest pain, dizziness, palpitations and pedal edema.  RESPIRATORY: Negative for recent cough and dyspnea.  GASTROINTESTINAL: Negative for abdominal pain, acid reflux symptoms, constipation, diarrhea, nausea and vomiting.  INTEGUMENTARY: Negative for rash.  NEUROLOGICAL: Negative for dizziness and headaches.  PSYCHIATRIC: see HPI   Current Outpatient  Medications:    magic mouthwash (lidocaine, diphenhydrAMINE, alum & mag hydroxide) suspension, Swish and spit 5 mLs 3 (three) times daily as needed for mouth pain., Disp: 360 mL, Rfl: 0   phentermine 37.5 MG capsule, Take 1 capsule (37.5 mg total) by mouth every morning., Disp: 30 capsule, Rfl: 1   tirzepatide (ZEPBOUND) 2.5 MG/0.5ML injection vial, Inject 2.5 mg into the skin once a week., Disp: 2 mL, Rfl: 0   traZODone (DESYREL) 50 MG tablet, Take 1 tablet (50 mg total) by mouth at bedtime., Disp: 30 tablet, Rfl: 1  Past Medical History:  Diagnosis Date   Bipolar disorder, current episode depressed, moderate (HCC)    Essential (primary) hypertension    Morbid (severe) obesity due to excess calories (HCC)    Other hypertrophic disorders of the skin    Objective:  PHYSICAL EXAM:   BP 118/82 (BP Location: Left Arm, Patient Position: Sitting)   Pulse 85   Temp (!) 97.2 F (36.2 C) (Temporal)   Ht 5\' 9"  (1.753 m)   Wt 253 lb (114.8 kg)   SpO2 100%   BMI 37.36 kg/m    GEN: Well nourished, well developed, in no acute distress  Cardiac: RRR; no murmurs, rubs, or gallops,no edema -  Respiratory:  normal respiratory rate and pattern with no distress - normal breath sounds with no  rales, rhonchi, wheezes or rubs  Psych: euthymic mood, appropriate affect and demeanor  Assessment & Plan:    Aphthous ulcer -     magic mouthwash (lidocaine, diphenhydrAMINE, alum & mag hydroxide) suspension; Swish and spit 5 mLs 3 (three) times daily as needed for mouth pain.  Dispense: 360 mL; Refill: 0  Morbid obesity (HCC) -     Tirzepatide-Weight Management; Inject 2.5 mg into the skin once a week.  Dispense: 2 mL; Refill: 0  Primary insomnia -     traZODone HCl; Take 1 tablet (50 mg total) by mouth at bedtime.  Dispense: 30 tablet; Refill: 1     Follow-up: Return in about 2 months (around 05/26/2023) for chronic fasting follow-up.  An After Visit Summary was printed and given to the  patient.  Jettie Pagan [facility] [phone]

## 2023-03-30 ENCOUNTER — Other Ambulatory Visit: Payer: Self-pay

## 2023-03-30 MED ORDER — PHENTERMINE HCL 37.5 MG PO CAPS: 37.5000 mg | ORAL_CAPSULE | ORAL | 0 refills | Status: DC

## 2023-04-08 ENCOUNTER — Telehealth: Payer: Self-pay

## 2023-04-08 NOTE — Telephone Encounter (Signed)
Called patient made her aware, PA has been done, and waiting on insurance answer.  Copied from CRM [id number]. Topic: Clinical - Medication Question >> Apr 07, 2023  8:42 AM Shannon Morgan wrote: Reason for CRM: Patient is calling to follow on the status of the pre-auth for her tirzepatide (ZEPBOUND) 2.5 MG/0.5ML injection vial medication.

## 2023-04-30 ENCOUNTER — Telehealth: Payer: Self-pay

## 2023-04-30 NOTE — Telephone Encounter (Signed)
I left a message on the number(s) listed in the patients chart requesting the patient to call back regarding the upcomming appointment for 05/27/2023. The provider is out of the office that day. The appointment has been canceled. Waiting for the patient to return the call.  NOTE: If the patient does not call back within a week to reschedule this appointment, the front staff will mail the patient a letter requesting to call the office back.

## 2023-05-07 NOTE — Telephone Encounter (Signed)
 I have mailed the patient a letter to call the office to reschedule her appointment.

## 2023-05-12 ENCOUNTER — Other Ambulatory Visit: Payer: Self-pay

## 2023-05-12 ENCOUNTER — Emergency Department (HOSPITAL_BASED_OUTPATIENT_CLINIC_OR_DEPARTMENT_OTHER): Payer: Medicaid Other

## 2023-05-12 ENCOUNTER — Emergency Department (HOSPITAL_BASED_OUTPATIENT_CLINIC_OR_DEPARTMENT_OTHER)
Admission: EM | Admit: 2023-05-12 | Discharge: 2023-05-12 | Disposition: A | Discharge status: Home / Self Care | Payer: Medicaid Other | Attending: Emergency Medicine | Admitting: Emergency Medicine

## 2023-05-12 ENCOUNTER — Ambulatory Visit: Payer: Self-pay | Admitting: Physician Assistant

## 2023-05-12 ENCOUNTER — Encounter (HOSPITAL_BASED_OUTPATIENT_CLINIC_OR_DEPARTMENT_OTHER): Payer: Self-pay

## 2023-05-12 DIAGNOSIS — L02512 Cutaneous abscess of left hand: Secondary | ICD-10-CM | POA: Diagnosis present

## 2023-05-12 DIAGNOSIS — L03114 Cellulitis of left upper limb: Secondary | ICD-10-CM | POA: Diagnosis not present

## 2023-05-12 DIAGNOSIS — L03119 Cellulitis of unspecified part of limb: Secondary | ICD-10-CM

## 2023-05-12 DIAGNOSIS — L989 Disorder of the skin and subcutaneous tissue, unspecified: Secondary | ICD-10-CM

## 2023-05-12 LAB — CBC WITH DIFFERENTIAL/PLATELET
Abs Immature Granulocytes: 0.04 10*3/uL (ref 0.00–0.07)
Basophils Absolute: 0 10*3/uL (ref 0.0–0.1)
Basophils Relative: 1 %
Eosinophils Absolute: 0.1 10*3/uL (ref 0.0–0.5)
Eosinophils Relative: 1 %
HCT: 40.2 % (ref 36.0–46.0)
Hemoglobin: 14 g/dL (ref 12.0–15.0)
Immature Granulocytes: 1 %
Lymphocytes Relative: 26 %
Lymphs Abs: 2.2 10*3/uL (ref 0.7–4.0)
MCH: 31.7 pg (ref 26.0–34.0)
MCHC: 34.8 g/dL (ref 30.0–36.0)
MCV: 91 fL (ref 80.0–100.0)
Monocytes Absolute: 0.4 10*3/uL (ref 0.1–1.0)
Monocytes Relative: 5 %
Neutro Abs: 5.7 10*3/uL (ref 1.7–7.7)
Neutrophils Relative %: 66 %
Platelets: 464 10*3/uL — ABNORMAL HIGH (ref 150–400)
RBC: 4.42 MIL/uL (ref 3.87–5.11)
RDW: 12.4 % (ref 11.5–15.5)
Smear Review: INCREASED
WBC: 8.4 10*3/uL (ref 4.0–10.5)
nRBC: 0 % (ref 0.0–0.2)

## 2023-05-12 LAB — COMPREHENSIVE METABOLIC PANEL
ALT: 15 U/L (ref 0–44)
AST: 19 U/L (ref 15–41)
Albumin: 4.7 g/dL (ref 3.5–5.0)
Alkaline Phosphatase: 75 U/L (ref 38–126)
Anion gap: 7 (ref 5–15)
BUN: 13 mg/dL (ref 6–20)
CO2: 22 mmol/L (ref 22–32)
Calcium: 9.2 mg/dL (ref 8.9–10.3)
Chloride: 105 mmol/L (ref 98–111)
Creatinine, Ser: 0.69 mg/dL (ref 0.44–1.00)
GFR, Estimated: >60 mL/min (ref >60)
Glucose, Bld: 88 mg/dL (ref 70–99)
Potassium: 3.7 mmol/L (ref 3.5–5.1)
Sodium: 134 mmol/L — ABNORMAL LOW (ref 135–145)
Total Bilirubin: 0.6 mg/dL (ref 0.0–1.2)
Total Protein: 8.1 g/dL (ref 6.5–8.1)

## 2023-05-12 LAB — LACTIC ACID, PLASMA: Lactic Acid, Venous: 1 mmol/L (ref 0.5–1.9)

## 2023-05-12 MED ORDER — DOXYCYCLINE HYCLATE 100 MG PO CAPS: 100.0000 mg | ORAL_CAPSULE | Freq: Two times a day (BID) | ORAL | 0 refills | Status: DC

## 2023-05-12 MED ORDER — CEFTRIAXONE SODIUM 500 MG IJ SOLR
500.0000 mg | Freq: Once | INTRAMUSCULAR | Status: AC
  Administered 2023-05-12: 500 mg via INTRAMUSCULAR
  Filled 2023-05-12: qty 500

## 2023-05-12 MED ORDER — OXYCODONE HCL 5 MG PO TABS: 5.0000 mg | ORAL_TABLET | Freq: Four times a day (QID) | ORAL | 0 refills | Status: AC | PRN

## 2023-05-12 MED ORDER — LIDOCAINE HCL (PF) 1 % IJ SOLN
1.0000 mL | Freq: Once | INTRAMUSCULAR | Status: AC
  Administered 2023-05-12: 1 mL
  Filled 2023-05-12: qty 5

## 2023-05-12 MED ORDER — IBUPROFEN 800 MG PO TABS: 800.0000 mg | ORAL_TABLET | Freq: Three times a day (TID) | ORAL | 0 refills | Status: DC | PRN

## 2023-05-12 MED ORDER — DEXTROSE 5 % IV SOLN: 100.0000 mg | Freq: Once | INTRAVENOUS | Status: DC

## 2023-05-12 NOTE — ED Notes (Signed)
 D/c paperwork reviewed with pt, including prescriptions and follow up care.  All questions and/or concerns addressed at time of d/c.  No further needs expressed. . Pt verbalized understanding, Ambulatory without assistance to ED exit, NAD.

## 2023-05-12 NOTE — Telephone Encounter (Signed)
 Copied from CRM [id number]. Topic: Clinical - Red Word Triage >> May 12, 2023 10:30 AM Shannon  Morgan wrote: Red Word that prompted transfer to Nurse Triage: Red line on arm, blister, painful, itchy, Severe pain, arm feels like it has a fever.  Chief Complaint: blister shingles Symptoms: blister to left hand with hot red line going up arm Frequency: started yesterday am Pertinent Negatives: Patient denies fever, but states the arm feels like it has a fever Disposition: [x] ED /[] Urgent Care (no appt availability in office) / [] Appointment(In office/virtual)/ []  [facility]/ [] Home Care/ [] Refused Recommended Disposition /[] [facility]/ []  Follow-up with PCP Additional Notes: patient states had this about 6 years ago and had to go to er for IV antibiotics.  Patient on way to ER now.  PCP office updated.   Reason for Disposition  Sounds like a life-threatening emergency to the triager  Answer Assessment - Initial Assessment Questions 1. APPEARANCE of RASH: Describe the rash.      I had a staph infection when I was young, and it appears on my left hand.  I have not had this in about 6 years Very vibrant red streak from blister up arm 2. LOCATION: Where is the rash located?      Left hand 3. ONSET: When did the rash start?      Yesterday am, I woke up with it 4. ITCHING: Does the rash itch? If Yes, ask: How bad is the itch?  (Scale 1-10; or mild, moderate, severe)     yes 5. PAIN: Does the rash hurt? If Yes, ask: How bad is the pain?  (Scale 0-10; or none, mild, moderate, severe)    - NONE (0): no pain    - MILD (1-3): doesn't interfere with normal activities     - MODERATE (4-7): interferes with normal activities or awakens from sleep     - SEVERE (8-10): excruciating pain, unable to do any normal activities     7/10 6. OTHER SYMPTOMS: Do you have any other symptoms? (e.g., fever)     Arm feels like it has a fever.  Protocols used: Shingles  (Zoster)-A-AH

## 2023-05-12 NOTE — ED Notes (Signed)
 Pts hand wrapped with non adherent dressing and coban to provide cushion against any impact, per pt request.  No further needs expressed.

## 2023-05-12 NOTE — ED Triage Notes (Addendum)
 Pt reports hx of staph infection. Woke up with abscess area to left palm area. Red streaks going up arm. Pain in wrist area. Unable to move thumb

## 2023-05-12 NOTE — ED Provider Notes (Signed)
 [facility] Provider Note   CSN: [id number] Arrival date & time: 05/12/23  1112     History  Chief Complaint  Patient presents with   Abscess    Shannon Morgan is a 33 y.o. female.  Patient is a 33 year old female presenting for wound on palm.  Patient has circular wound to the left palm that developed this morning.  Admits to swelling, warmth, pain, and streaking going up the wrist.  Admits to a prior history of Staph aureus infection that required hospitalization with IV antibiotics.  Denies history of methadone resistant Staph aureus infection.  Denies history of osteomyelitis.  Denies IV drug use.  No fevers, nausea, vomiting, or myalgias.  No previous antibiotic use for this wound.  The history is provided by the patient. No language interpreter was used.  Abscess Associated symptoms: no fever and no vomiting        Home Medications Prior to Admission medications   Medication Sig Start Date End Date Taking? Authorizing Provider  doxycycline  (VIBRAMYCIN ) 100 MG capsule Take 1 capsule (100 mg total) by mouth 2 (two) times daily for 10 days. 05/12/23 05/22/23 Yes Elnor Hila P, DO  magic mouthwash (lidocaine , diphenhydrAMINE, alum & mag hydroxide) suspension Swish and spit 5 mLs 3 (three) times daily as needed for mouth pain. 03/26/23   Nicholaus Credit, PA-C  phentermine  37.5 MG capsule Take 1 capsule (37.5 mg total) by mouth every morning. 03/30/23   Nicholaus Credit, PA-C  tirzepatide  (ZEPBOUND ) 2.5 MG/0.5ML injection vial Inject 2.5 mg into the skin once a week. 03/26/23   Nicholaus Credit, PA-C  traZODone  (DESYREL ) 50 MG tablet Take 1 tablet (50 mg total) by mouth at bedtime. 03/26/23   Nicholaus Credit, PA-C      Allergies    Penicillins, Other, Sertraline, Amoxicillin, Cranberry, Hydrocodone -acetaminophen, Zoloft [sertraline hcl], and Codeine    Review of Systems   Review of Systems  Constitutional:  Negative for chills and fever.  HENT:   Negative for ear pain and sore throat.   Eyes:  Negative for pain and visual disturbance.  Respiratory:  Negative for cough and shortness of breath.   Cardiovascular:  Negative for chest pain and palpitations.  Gastrointestinal:  Negative for abdominal pain and vomiting.  Genitourinary:  Negative for dysuria and hematuria.  Musculoskeletal:  Negative for arthralgias and back pain.  Skin:  Positive for wound. Negative for color change and rash.  Neurological:  Negative for seizures and syncope.  All other systems reviewed and are negative.   Physical Exam Updated Vital Signs BP 111/67 (BP Location: Left Arm)   Pulse 88   Temp 98.2 F (36.8 C)   Resp 18   Wt 109.3 kg   SpO2 90%   BMI 35.59 kg/m  Physical Exam Vitals and nursing note reviewed.  Constitutional:      Appearance: Normal appearance.  HENT:     Head: Normocephalic and atraumatic.  Cardiovascular:     Rate and Rhythm: Normal rate.  Pulmonary:     Effort: Pulmonary effort is normal.  Skin:    Capillary Refill: Capillary refill takes less than 2 seconds.       Neurological:     Mental Status: She is alert.     ED Results / Procedures / Treatments   Labs (all labs ordered are listed, but only abnormal results are displayed) Labs Reviewed  COMPREHENSIVE METABOLIC PANEL - Abnormal; Notable for the following components:      Result  Value   Sodium 134 (*)    All other components within normal limits  CBC WITH DIFFERENTIAL/PLATELET - Abnormal; Notable for the following components:   Platelets 464 (*)    All other components within normal limits  LACTIC ACID, PLASMA  URINALYSIS, W/ REFLEX TO CULTURE (INFECTION SUSPECTED)  PREGNANCY, URINE    EKG None  Radiology DG Chest 2 View Result Date: 05/12/2023 CLINICAL DATA:  History of staph infection and abscess in the left palm. EXAM: CHEST - 2 VIEW COMPARISON:  None Available. FINDINGS: The heart size and mediastinal contours are within normal limits. Both lungs  are clear. The visualized skeletal structures are unremarkable. IMPRESSION: No active cardiopulmonary disease. Electronically Signed   By: Vanetta Chou M.D.   On: 05/12/2023 12:40    Procedures Procedures    Medications Ordered in ED Medications  cefTRIAXone  (ROCEPHIN ) injection 500 mg (has no administration in time range)  lidocaine  (PF) (XYLOCAINE ) 1 % injection 1-2.1 mL (has no administration in time range)    ED Course/ Medical Decision Making/ A&P                                 Medical Decision Making Amount and/or Complexity of Data Reviewed Labs: ordered. Radiology: ordered.   33 year old female presenting for wound on left palm.  Wound started this morning.  No purulent drainage.  No fluctuance.  Induration, erythema, tenderness present.  She has a focal less than 1 cm circular wound to the left thenar eminence.  Otherwise stable vitals.  No signs or symptoms of sepsis.  IV antibiotics given in ED and sent to pharmacy.  Patient in no distress and overall condition improved here in the ED. Detailed discussions were had with the patient regarding current findings, and need for close f/u with PCP or on call doctor. The patient has been instructed to return immediately if the symptoms worsen in any way for re-evaluation. Patient verbalized understanding and is in agreement with current care plan. All questions answered prior to discharge.         Final Clinical Impression(s) / ED Diagnoses Final diagnoses:  Hand lesion  Cellulitis of palm of hand    Rx / DC Orders ED Discharge Orders          Ordered    doxycycline  (VIBRAMYCIN ) 100 MG capsule  2 times daily        05/12/23 1312              Elnor Bernarda SQUIBB, DO 05/12/23 1316

## 2023-05-12 NOTE — Discharge Instructions (Addendum)
 Follow-up with your primary care physician or return to emergency department in 24 to 48 hours if symptoms of right hand wound and cellulitis is worsening or does not improve

## 2023-05-20 ENCOUNTER — Ambulatory Visit: Payer: Medicaid Other | Admitting: Physician Assistant

## 2023-05-20 ENCOUNTER — Encounter: Payer: Self-pay | Admitting: Physician Assistant

## 2023-05-20 VITALS — BP 100/70 | HR 84 | Temp 97.3°F | Resp 18 | Ht 69.0 in | Wt 255.6 lb

## 2023-05-20 DIAGNOSIS — L089 Local infection of the skin and subcutaneous tissue, unspecified: Secondary | ICD-10-CM | POA: Diagnosis not present

## 2023-05-20 DIAGNOSIS — R5383 Other fatigue: Secondary | ICD-10-CM

## 2023-05-20 DIAGNOSIS — B958 Unspecified staphylococcus as the cause of diseases classified elsewhere: Secondary | ICD-10-CM | POA: Diagnosis not present

## 2023-05-20 DIAGNOSIS — E786 Lipoprotein deficiency: Secondary | ICD-10-CM | POA: Diagnosis not present

## 2023-05-20 MED ORDER — TIRZEPATIDE-WEIGHT MANAGEMENT 5 MG/0.5ML ~~LOC~~ SOLN: 5.0000 mg | SUBCUTANEOUS | 0 refills | Status: DC

## 2023-05-20 MED ORDER — SULFAMETHOXAZOLE-TRIMETHOPRIM 800-160 MG PO TABS: 1.0000 | ORAL_TABLET | Freq: Two times a day (BID) | ORAL | 0 refills | Status: DC

## 2023-05-20 NOTE — Progress Notes (Signed)
 Subjective:  Patient ID: Shannon Morgan, female    DOB: 1991/02/17  Age: 33 y.o. MRN: [id number]  Chief Complaint  Patient presents with   Medical Management of Chronic Issues    HPI Pt with complaints of staph infection of left hand - she was seen at ED and treated with doxycycline  - she has a small pustule still on hand and is getting red and inflamed again - no area of induration Pt denies fever  Pt here for follow up of zepbound  - she has started the 2.5mg  weekly and has 2 more pens to take - would like to continue medication and increase dose  Pt due for labwork - has had mild fatigue in  past and also low HDL - will obtain cbc, cmp, tsh and lipid panels     05/20/2023    9:36 AM 03/26/2023   10:33 AM 11/25/2022    8:13 AM  Depression screen PHQ 2/9  Decreased Interest 0 0 0  Down, Depressed, Hopeless 0 0 0  PHQ - 2 Score 0 0 0  Altered sleeping 0 3   Tired, decreased energy 0 1   Change in appetite 0 0   Feeling bad or failure about yourself  0 0   Trouble concentrating 0 0   Moving slowly or fidgety/restless 0 0   Suicidal thoughts 0 0   PHQ-9 Score 0 4   Difficult doing work/chores Not difficult at all Not difficult at all         11/25/2022    8:14 AM 03/26/2023   10:33 AM 05/20/2023    9:36 AM  Fall Risk  Falls in the past year? 0 0 0  Was there an injury with Fall? 0 0 0  Fall Risk Category Calculator 0 0 0  Patient at Risk for Falls Due to No Fall Risks No Fall Risks No Fall Risks  Fall risk Follow up Falls evaluation completed Falls evaluation completed Falls evaluation completed     ROS CONSTITUTIONAL: see HPI E/N/T: Negative for ear pain, nasal congestion and sore throat.  CARDIOVASCULAR: Negative for chest pain, dizziness,  RESPIRATORY: Negative for recent cough and dyspnea.   INTEGUMENTARY: see HPI    Current Outpatient Medications:    ibuprofen  (ADVIL ) 800 MG tablet, Take 1 tablet (800 mg total) by mouth every 8 (eight) hours as needed for  mild pain (pain score 1-3)., Disp: 21 tablet, Rfl: 0   sulfamethoxazole -trimethoprim  (BACTRIM  DS) 800-160 MG tablet, Take 1 tablet by mouth 2 (two) times daily., Disp: 20 tablet, Rfl: 0   tirzepatide  5 MG/0.5ML injection vial, Inject 5 mg into the skin once a week., Disp: 2 mL, Rfl: 0  Past Medical History:  Diagnosis Date   Bipolar disorder, current episode depressed, moderate (HCC)    Essential (primary) hypertension    Morbid (severe) obesity due to excess calories (HCC)    Other hypertrophic disorders of the skin    Objective:  PHYSICAL EXAM:   BP 100/70 (BP Location: Left Arm, Patient Position: Sitting, Cuff Size: Large)   Pulse 84   Temp (!) 97.3 F (36.3 C) (Temporal)   Resp 18   Ht 5\' 9"  (1.753 m)   Wt 255 lb 9.6 oz (115.9 kg)   SpO2 97%   BMI 37.75 kg/m    GEN: Well nourished, well developed, in no acute distress  Cardiac: RRR; no murmurs,  Respiratory:  normal respiratory rate and pattern with no distress - normal breath sounds with no rales,  rhonchi, wheezes or rubs Skin: small pustule on left thenar eminence   Assessment & Plan:    Staph skin infection -     Sulfamethoxazole -Trimethoprim ; Take 1 tablet by mouth 2 (two) times daily.  Dispense: 20 tablet; Refill: 0  Other fatigue -     CBC with Differential/Platelet -     Comprehensive metabolic panel -     TSH  Morbid obesity (HCC) -     Tirzepatide -Weight Management; Inject 5 mg into the skin once a week.  Dispense: 2 mL; Refill: 0  Low HDL (under 40) -     Lipid panel     Follow-up: Return in about 3 months (around 08/18/2023) for follow-up.  An After Visit Summary was printed and given to the patient.  Anthonette Bastos [facility] [phone]

## 2023-05-20 NOTE — Addendum Note (Signed)
 Addended by: Cyndi Drain on: 05/20/2023 10:15 AM   Modules accepted: Orders

## 2023-05-22 ENCOUNTER — Other Ambulatory Visit: Payer: Medicaid Other

## 2023-05-22 DIAGNOSIS — E786 Lipoprotein deficiency: Secondary | ICD-10-CM

## 2023-05-22 DIAGNOSIS — R5383 Other fatigue: Secondary | ICD-10-CM

## 2023-05-23 LAB — CBC WITH DIFFERENTIAL/PLATELET
Basophils Absolute: 0.1 10*3/uL (ref 0.0–0.2)
Basos: 1 %
EOS (ABSOLUTE): 0.1 10*3/uL (ref 0.0–0.4)
Eos: 1 %
Hematocrit: 40.4 % (ref 34.0–46.6)
Hemoglobin: 13.7 g/dL (ref 11.1–15.9)
Immature Grans (Abs): 0.1 10*3/uL (ref 0.0–0.1)
Immature Granulocytes: 1 %
Lymphocytes Absolute: 2.8 10*3/uL (ref 0.7–3.1)
Lymphs: 29 %
MCH: 31.9 pg (ref 26.6–33.0)
MCHC: 33.9 g/dL (ref 31.5–35.7)
MCV: 94 fL (ref 79–97)
Monocytes Absolute: 0.6 10*3/uL (ref 0.1–0.9)
Monocytes: 6 %
Neutrophils Absolute: 6.1 10*3/uL (ref 1.4–7.0)
Neutrophils: 62 %
Platelets: 495 10*3/uL — ABNORMAL HIGH (ref 150–450)
RBC: 4.3 x10E6/uL (ref 3.77–5.28)
RDW: 12.2 % (ref 11.7–15.4)
WBC: 9.7 10*3/uL (ref 3.4–10.8)

## 2023-05-23 LAB — COMPREHENSIVE METABOLIC PANEL
ALT: 11 [IU]/L (ref 0–32)
AST: 15 [IU]/L (ref 0–40)
Albumin: 4.7 g/dL (ref 3.9–4.9)
Alkaline Phosphatase: 92 [IU]/L (ref 44–121)
BUN/Creatinine Ratio: 19 (ref 9–23)
BUN: 15 mg/dL (ref 6–20)
Bilirubin Total: 0.7 mg/dL (ref 0.0–1.2)
CO2: 21 mmol/L (ref 20–29)
Calcium: 9.6 mg/dL (ref 8.7–10.2)
Chloride: 100 mmol/L (ref 96–106)
Creatinine, Ser: 0.8 mg/dL (ref 0.57–1.00)
Globulin, Total: 2.5 g/dL (ref 1.5–4.5)
Glucose: 79 mg/dL (ref 70–99)
Potassium: 5.4 mmol/L — ABNORMAL HIGH (ref 3.5–5.2)
Sodium: 136 mmol/L (ref 134–144)
Total Protein: 7.2 g/dL (ref 6.0–8.5)
eGFR: 101 mL/min/{1.73_m2} (ref >59)

## 2023-05-23 LAB — LIPID PANEL
Chol/HDL Ratio: 4 {ratio} (ref 0.0–4.4)
Cholesterol, Total: 156 mg/dL (ref 100–199)
HDL: 39 mg/dL — ABNORMAL LOW (ref >39)
LDL Chol Calc (NIH): 94 mg/dL (ref 0–99)
Triglycerides: 129 mg/dL (ref 0–149)
VLDL Cholesterol Cal: 23 mg/dL (ref 5–40)

## 2023-05-23 LAB — TSH: TSH: 3.78 u[IU]/mL (ref 0.450–4.500)

## 2023-05-25 ENCOUNTER — Encounter: Payer: Self-pay | Admitting: Family Medicine

## 2023-05-27 ENCOUNTER — Ambulatory Visit: Payer: Medicaid Other | Admitting: Physician Assistant

## 2023-06-08 ENCOUNTER — Other Ambulatory Visit: Payer: Self-pay | Admitting: Physician Assistant

## 2023-06-08 MED ORDER — TIRZEPATIDE 7.5 MG/0.5ML ~~LOC~~ SOAJ: 7.5000 mg | SUBCUTANEOUS | 0 refills | Status: DC

## 2023-06-09 ENCOUNTER — Encounter: Payer: Self-pay | Admitting: Physician Assistant

## 2023-06-09 ENCOUNTER — Other Ambulatory Visit: Payer: Self-pay | Admitting: Physician Assistant

## 2023-06-09 MED ORDER — ZEPBOUND 7.5 MG/0.5ML ~~LOC~~ SOAJ: 7.5000 mg | SUBCUTANEOUS | 0 refills | Status: DC

## 2023-07-12 ENCOUNTER — Other Ambulatory Visit: Payer: Self-pay | Admitting: Physician Assistant

## 2023-07-13 MED ORDER — ZEPBOUND 7.5 MG/0.5ML ~~LOC~~ SOAJ
7.5000 mg | SUBCUTANEOUS | 0 refills | Status: DC
Start: 2023-07-13 — End: 2023-08-04

## 2023-07-22 ENCOUNTER — Ambulatory Visit: Admitting: Physician Assistant

## 2023-07-22 ENCOUNTER — Encounter: Payer: Self-pay | Admitting: Physician Assistant

## 2023-07-22 VITALS — BP 120/82 | HR 72 | Temp 97.6°F | Ht 69.0 in | Wt 247.4 lb

## 2023-07-22 DIAGNOSIS — F5101 Primary insomnia: Secondary | ICD-10-CM | POA: Diagnosis not present

## 2023-07-22 DIAGNOSIS — F419 Anxiety disorder, unspecified: Secondary | ICD-10-CM

## 2023-07-22 DIAGNOSIS — B353 Tinea pedis: Secondary | ICD-10-CM | POA: Diagnosis not present

## 2023-07-22 MED ORDER — CLONAZEPAM 0.5 MG PO TABS: ORAL_TABLET | ORAL | 1 refills | Status: DC

## 2023-07-22 MED ORDER — TERBINAFINE HCL 250 MG PO TABS: 250.0000 mg | ORAL_TABLET | Freq: Every day | ORAL | 0 refills | Status: DC

## 2023-07-22 NOTE — Progress Notes (Signed)
   Acute Office Visit  Subjective:    Patient ID: Shannon Morgan, female    DOB: Feb 15, 1991, 33 y.o.   MRN: [id number]  Chief Complaint  Patient presents with   Rash    foot    HPI: Patient is in today for complaints of rash on both feet. States she has issues mostly in the summers with this issue and has used ketoconzole cream in past which has not been helpful.  Feet get red, scaly and peel on bottom, sides and between toes.  Pt would like to try another medication to help with sleeping - she has tried trazadone and ativan in the past - neither which she could tolerate.  Feels like she is anxious most nights and worries - can't seem to cut her thoughts off.  Has trouble falling asleep  Pt in for follow up of weight management - states overall is doing well on zepbound.  Is watching diet and staying active.  She has lost about 10 pounds since last visit   Current Outpatient Medications:    clonazePAM (KLONOPIN) 0.5 MG tablet, 1/2 - 1 po at bedtime prn anxiety, Disp: 30 tablet, Rfl: 1   terbinafine (LAMISIL) 250 MG tablet, Take 1 tablet (250 mg total) by mouth daily., Disp: 14 tablet, Rfl: 0   tirzepatide (ZEPBOUND) 7.5 MG/0.5ML Pen, Inject 7.5 mg into the skin once a week., Disp: 2 mL, Rfl: 0  Allergies  Allergen Reactions   Penicillins Anaphylaxis and Rash   Other Other (See Comments)    suicidal   Sertraline Itching and Other (See Comments)    suicidal suicidal suicidal suicidal suicidal  suicidal suicidal   Amoxicillin     Other reaction(s): Hives ([id number])   Ativan [Lorazepam]     Bad dreams   Cranberry    Hydrocodone-Acetaminophen Other (See Comments)   Trazodone And Nefazodone     Not tolerate   Zoloft [Sertraline Hcl]    Codeine Rash    ROS CONSTITUTIONAL: Negative for chills, fatigue, fever,   CARDIOVASCULAR: Negative for chest pain,  RESPIRATORY: Negative for recent cough and dyspnea.   INTEGUMENTARY: see HPI  PSYCHIATRIC: see HPI      Objective:    PHYSICAL EXAM:   BP 120/82   Pulse 72   Temp 97.6 F (36.4 C)   Ht 5\' 9"  (1.753 m)   Wt 247 lb 6.4 oz (112.2 kg)   SpO2 98%   BMI 36.53 kg/m    GEN: Well nourished, well developed, in no acute distress   Cardiac: RRR; no murmurs,  Respiratory:  normal respiratory rate and pattern with no distress - normal breath sounds with no rales, rhonchi, wheezes or rubs GI: normal bowel sounds, no masses or tenderness  Skin: warm and dry, no rash   Psych: euthymic mood, appropriate affect and demeanor     Assessment & Plan:    Tinea pedis of both feet -     Terbinafine HCl; Take 1 tablet (250 mg total) by mouth daily.  Dispense: 14 tablet; Refill: 0  Anxiety -     clonazePAM; 1/2 - 1 po at bedtime prn anxiety  Dispense: 30 tablet; Refill: 1  Morbid obesity (HCC) Continue zepbound Primary insomnia Sleep hygiene Try klonopin only as needed    Follow-up: Return in about 3 months (around 10/22/2023).  An After Visit Summary was printed and given to the patient.  Jettie Pagan [facility] [phone]

## 2023-07-29 ENCOUNTER — Other Ambulatory Visit: Payer: Self-pay | Admitting: Physician Assistant

## 2023-07-30 ENCOUNTER — Encounter: Payer: Self-pay | Admitting: Physician Assistant

## 2023-07-30 NOTE — Telephone Encounter (Signed)
 Notify pt she has not been on this med long enough --- just recently started the 7.5mg  dose and has lost about 10 pounds  Looks like working as it should

## 2023-08-04 ENCOUNTER — Other Ambulatory Visit: Payer: Self-pay | Admitting: Physician Assistant

## 2023-08-04 MED ORDER — TIRZEPATIDE-WEIGHT MANAGEMENT 10 MG/0.5ML ~~LOC~~ SOLN: 10.0000 mg | SUBCUTANEOUS | 0 refills | Status: DC

## 2023-08-20 ENCOUNTER — Ambulatory Visit: Payer: Medicaid Other | Admitting: Physician Assistant

## 2023-08-31 ENCOUNTER — Other Ambulatory Visit: Payer: Self-pay | Admitting: Physician Assistant

## 2023-09-01 ENCOUNTER — Other Ambulatory Visit: Payer: Self-pay | Admitting: Physician Assistant

## 2023-09-01 MED ORDER — ZEPBOUND 12.5 MG/0.5ML ~~LOC~~ SOAJ: 12.5000 mg | SUBCUTANEOUS | 0 refills | Status: DC

## 2023-09-18 ENCOUNTER — Encounter: Payer: Self-pay | Admitting: Physician Assistant

## 2023-09-21 ENCOUNTER — Other Ambulatory Visit: Payer: Self-pay | Admitting: Physician Assistant

## 2023-09-21 DIAGNOSIS — F419 Anxiety disorder, unspecified: Secondary | ICD-10-CM

## 2023-09-21 MED ORDER — CLONAZEPAM 0.5 MG PO TABS: ORAL_TABLET | ORAL | 1 refills | Status: DC

## 2023-09-29 ENCOUNTER — Encounter: Payer: Self-pay | Admitting: Physician Assistant

## 2023-10-01 ENCOUNTER — Other Ambulatory Visit: Payer: Self-pay | Admitting: Family Medicine

## 2023-10-01 MED ORDER — ZEPBOUND 12.5 MG/0.5ML ~~LOC~~ SOAJ: 12.5000 mg | SUBCUTANEOUS | 0 refills | Status: DC

## 2023-10-22 ENCOUNTER — Encounter: Payer: Self-pay | Admitting: Physician Assistant

## 2023-10-22 ENCOUNTER — Ambulatory Visit: Admitting: Physician Assistant

## 2023-10-22 VITALS — BP 114/82 | HR 97 | Temp 97.6°F | Ht 69.0 in | Wt 239.8 lb

## 2023-10-22 DIAGNOSIS — F419 Anxiety disorder, unspecified: Secondary | ICD-10-CM

## 2023-10-22 DIAGNOSIS — H60333 Swimmer's ear, bilateral: Secondary | ICD-10-CM | POA: Diagnosis not present

## 2023-10-22 DIAGNOSIS — L918 Other hypertrophic disorders of the skin: Secondary | ICD-10-CM | POA: Insufficient documentation

## 2023-10-22 DIAGNOSIS — B353 Tinea pedis: Secondary | ICD-10-CM | POA: Insufficient documentation

## 2023-10-22 MED ORDER — ZEPBOUND 15 MG/0.5ML ~~LOC~~ SOAJ: 15.0000 mg | SUBCUTANEOUS | 3 refills | Status: DC

## 2023-10-22 MED ORDER — TRIAMCINOLONE ACETONIDE 40 MG/ML IJ SUSP
60.0000 mg | Freq: Once | INTRAMUSCULAR | Status: AC
  Administered 2023-10-22: 60 mg via INTRAMUSCULAR

## 2023-10-22 MED ORDER — CLONAZEPAM 0.5 MG PO TABS: ORAL_TABLET | ORAL | 1 refills | Status: DC

## 2023-10-22 MED ORDER — OFLOXACIN 0.3 % OT SOLN: 5.0000 [drp] | Freq: Two times a day (BID) | OTIC | 0 refills | Status: DC

## 2023-10-22 NOTE — Progress Notes (Signed)
 Subjective:  Patient ID: Shannon Morgan, female    DOB: 03-17-1991  Age: 33 y.o. MRN: [id number]  Chief Complaint  Patient presents with   Medical Management of Chronic Issues    HPI Pt with complaints of bilateral ear pain - has been swimming recently and feels pressure in both ears but right ear is stopped up and draining Gets swimmers ear easily  Pt requests refill of klonopin  for anxiety - states med working well for her and not having any side effects  Pt doing well on zepbound .  She is currently on 12.5mg  weekly and is ready to move up to next dose.  She is tolerating med well without side effects.  She has increased her protein intake and eating healthy.  She does drink plenty of water daily.  She is exercising 4-5 times weekly     10/22/2023   10:28 AM 05/20/2023    9:36 AM 03/26/2023   10:33 AM 11/25/2022    8:13 AM  Depression screen PHQ 2/9  Decreased Interest 0 0 0 0  Down, Depressed, Hopeless 0 0 0 0  PHQ - 2 Score 0 0 0 0  Altered sleeping  0 3   Tired, decreased energy  0 1   Change in appetite  0 0   Feeling bad or failure about yourself   0 0   Trouble concentrating  0 0   Moving slowly or fidgety/restless  0 0   Suicidal thoughts  0 0   PHQ-9 Score  0 4   Difficult doing work/chores  Not difficult at all Not difficult at all         11/25/2022    8:14 AM 03/26/2023   10:33 AM 05/20/2023    9:36 AM 10/22/2023   10:28 AM  Fall Risk  Falls in the past year? 0 0 0 0  Was there an injury with Fall? 0 0 0 0  Fall Risk Category Calculator 0 0 0 0  Patient at Risk for Falls Due to No Fall Risks No Fall Risks No Fall Risks No Fall Risks  Fall risk Follow up Falls evaluation completed Falls evaluation completed Falls evaluation completed      ROS CONSTITUTIONAL: Negative for chills, fatigue, fever, E/N/T: see HPI CARDIOVASCULAR: Negative for chest pain, dizziness, palpitations and pedal edema.  RESPIRATORY: Negative for recent cough and dyspnea.   GASTROINTESTINAL: Negative for abdominal pain, acid reflux symptoms, constipation, diarrhea, nausea and vomiting.  PSYCHIATRIC: Negative for sleep disturbance and to question depression screen.  Negative for depression, negative for anhedonia.    Current Outpatient Medications:    ofloxacin (FLOXIN) 0.3 % OTIC solution, Place 5 drops into the left ear 2 (two) times daily., Disp: 5 mL, Rfl: 0   tirzepatide  (ZEPBOUND ) 15 MG/0.5ML Pen, Inject 15 mg into the skin once a week., Disp: 2 mL, Rfl: 3   clonazePAM  (KLONOPIN ) 0.5 MG tablet, 1/2 - 1 po at bedtime prn anxiety, Disp: 30 tablet, Rfl: 1  Past Medical History:  Diagnosis Date   Bipolar disorder, current episode depressed, moderate (HCC)    Essential (primary) hypertension    Morbid (severe) obesity due to excess calories (HCC)    Other hypertrophic disorders of the skin    Objective:  PHYSICAL EXAM:   BP 114/82   Pulse 97   Temp 97.6 F (36.4 C)   Ht 5' 9 (1.753 m)   Wt 239 lb 12.8 oz (108.8 kg)   SpO2 99%   BMI 35.41  kg/m    GEN: Well nourished, well developed, in no acute distress  HEENT: right TM and canal normal - left TM normal but canal swollen with mild drainage noted Oropharynx - normal mucosa, palate, and posterior pharynx Cardiac: RRR; no murmurs,  Respiratory:  normal respiratory rate and pattern with no distress - normal breath sounds with no rales, rhonchi, wheezes or rubs  Skin: warm and dry, no rash  Neuro:  Alert and Oriented x 3,  CN II-Xii grossly intact Psych: euthymic mood, appropriate affect and demeanor  Assessment & Plan:    Acute swimmer's ear of both sides -     Triamcinolone  Acetonide -     Ofloxacin; Place 5 drops into the left ear 2 (two) times daily.  Dispense: 5 mL; Refill: 0  Anxiety -     clonazePAM ; 1/2 - 1 po at bedtime prn anxiety  Dispense: 30 tablet; Refill: 1  Morbid obesity (HCC) -     Zepbound ; Inject 15 mg into the skin once a week.  Dispense: 2 mL; Refill: 3 Continue to  watch diet/exercise    Follow-up: Return in about 4 months (around 02/21/2024) for follow-up.  An After Visit Summary was printed and given to the patient.  Anthonette Bastos [facility] [phone]

## 2023-11-20 ENCOUNTER — Encounter: Payer: Self-pay | Admitting: Physician Assistant

## 2023-11-23 ENCOUNTER — Other Ambulatory Visit: Payer: Self-pay

## 2023-11-23 ENCOUNTER — Other Ambulatory Visit: Payer: Self-pay | Admitting: Physician Assistant

## 2023-11-23 MED ORDER — ZEPBOUND 15 MG/0.5ML ~~LOC~~ SOAJ
15.0000 mg | SUBCUTANEOUS | 3 refills | Status: DC
Start: 2023-11-23 — End: 2023-11-23

## 2023-11-23 MED ORDER — ZEPBOUND 12.5 MG/0.5ML ~~LOC~~ SOAJ: 12.5000 mg | SUBCUTANEOUS | 0 refills | Status: DC

## 2023-12-22 ENCOUNTER — Encounter: Payer: Self-pay | Admitting: Physician Assistant

## 2023-12-23 ENCOUNTER — Other Ambulatory Visit: Payer: Self-pay | Admitting: Physician Assistant

## 2023-12-23 DIAGNOSIS — F419 Anxiety disorder, unspecified: Secondary | ICD-10-CM

## 2023-12-23 MED ORDER — CLONAZEPAM 0.5 MG PO TABS: ORAL_TABLET | ORAL | 1 refills | Status: DC

## 2023-12-23 MED ORDER — ZEPBOUND 12.5 MG/0.5ML ~~LOC~~ SOAJ: 12.5000 mg | SUBCUTANEOUS | 0 refills | Status: DC

## 2024-01-11 ENCOUNTER — Other Ambulatory Visit: Payer: Self-pay | Admitting: Physician Assistant

## 2024-01-11 ENCOUNTER — Other Ambulatory Visit: Payer: Self-pay

## 2024-01-11 DIAGNOSIS — F419 Anxiety disorder, unspecified: Secondary | ICD-10-CM

## 2024-01-11 MED ORDER — CLONAZEPAM 0.5 MG PO TABS: ORAL_TABLET | ORAL | 1 refills | Status: DC

## 2024-01-11 MED ORDER — ZEPBOUND 12.5 MG/0.5ML ~~LOC~~ SOAJ: 12.5000 mg | SUBCUTANEOUS | 0 refills | Status: DC

## 2024-02-17 ENCOUNTER — Encounter: Payer: Self-pay | Admitting: Physician Assistant

## 2024-02-24 ENCOUNTER — Other Ambulatory Visit: Payer: Self-pay

## 2024-02-25 ENCOUNTER — Ambulatory Visit: Admitting: Physician Assistant

## 2024-03-10 ENCOUNTER — Ambulatory Visit: Admitting: Physician Assistant

## 2024-03-10 ENCOUNTER — Encounter: Payer: Self-pay | Admitting: Physician Assistant

## 2024-03-10 VITALS — BP 92/62 | HR 85 | Temp 97.5°F | Ht 69.0 in | Wt 232.8 lb

## 2024-03-10 DIAGNOSIS — R5381 Other malaise: Secondary | ICD-10-CM | POA: Diagnosis not present

## 2024-03-10 DIAGNOSIS — F418 Other specified anxiety disorders: Secondary | ICD-10-CM

## 2024-03-10 MED ORDER — CITALOPRAM HYDROBROMIDE 10 MG PO TABS: 10.0000 mg | ORAL_TABLET | Freq: Every day | ORAL | 1 refills | Status: AC

## 2024-03-10 MED ORDER — ZEPBOUND 12.5 MG/0.5ML ~~LOC~~ SOAJ: 12.5000 mg | SUBCUTANEOUS | 0 refills | Status: AC

## 2024-03-10 NOTE — Progress Notes (Signed)
 Subjective:  Patient ID: Shannon Morgan, female    DOB: 04-20-1991  Age: 33 y.o. MRN: [id number]  Chief Complaint  Patient presents with   Hospital follow up    HPI Pt in office today to follow up after being seen in ED for suicidal ideation.  She states that 2 weekends ago she had been drinking beer all weekend and her sister came in house and talking with her she was feeling depressed and that she felt things would be better off if she was not around anymore.  Her sister drove her to ED.  Pt states that over the course of the weekend she probably had upwards of 50 beers.  She was drunk at time she went to ED.  According to ED notes it stated she had taken oxycodone  and klonopin .  Patient states that her sister took both meds to ED with them and per there report had taken several pills.  Patient states she had not taken any pain med (which was prescribed by other provider for recent ear pain) and she did not take any klonopin  either (which she takes as needed for anxiety - her bottle not missing any pills) and she tested negative on drug screen Pt states she is not suicidal at this time but is having depressed mood and anxiety over recent separation from partner, dealing with court order - she is agreeable to try medication for this She has had some mild malaise as well and would like labwork done  Pt on zepbound  for weight loss - is tolerating medication well.  She has lost 7 pounds since last visit.  She is staying active and trying to exercise.  Watching diet and decreasing carbs     03/10/2024    1:25 PM 10/22/2023   10:28 AM 05/20/2023    9:36 AM 03/26/2023   10:33 AM 11/25/2022    8:13 AM  Depression screen PHQ 2/9  Decreased Interest 0 0 0 0 0  Down, Depressed, Hopeless 0 0 0 0 0  PHQ - 2 Score 0 0 0 0 0  Altered sleeping 0  0 3   Tired, decreased energy 0  0 1   Change in appetite 0  0 0   Feeling bad or failure about yourself  0  0 0   Trouble concentrating 0  0 0   Moving  slowly or fidgety/restless 0  0 0   Suicidal thoughts 0  0 0   PHQ-9 Score 0  0  4    Difficult doing work/chores Not difficult at all  Not difficult at all Not difficult at all      Data saved with a previous flowsheet row definition        11/25/2022    8:14 AM 03/26/2023   10:33 AM 05/20/2023    9:36 AM 10/22/2023   10:28 AM 03/10/2024    1:25 PM  Fall Risk  Falls in the past year? 0 0 0 0 0  Was there an injury with Fall? 0 0 0 0 0  Fall Risk Category Calculator 0 0 0 0 0  Patient at Risk for Falls Due to No Fall Risks No Fall Risks No Fall Risks No Fall Risks No Fall Risks  Fall risk Follow up Falls evaluation completed Falls evaluation completed Falls evaluation completed  Falls evaluation completed     ROS CONSTITUTIONAL: see HPI E/N/T: Negative for ear pain, nasal congestion and sore throat.  CARDIOVASCULAR: Negative for chest pain, dizziness,  palpitations and pedal edema.  RESPIRATORY: Negative for recent cough and dyspnea.  GASTROINTESTINAL: Negative for abdominal pain, acid reflux symptoms, constipation, diarrhea, nausea and vomiting.  MSK: Negative for arthralgias and myalgias.  INTEGUMENTARY: Negative for rash.  NEUROLOGICAL: Negative for dizziness and headaches.  PSYCHIATRIC: see HPI   Current Outpatient Medications:    citalopram (CELEXA) 10 MG tablet, Take 1 tablet (10 mg total) by mouth daily., Disp: 30 tablet, Rfl: 1   clonazePAM  (KLONOPIN ) 0.5 MG tablet, 1/2 - 1 po at bedtime prn anxiety, Disp: 30 tablet, Rfl: 1   tirzepatide  (ZEPBOUND ) 12.5 MG/0.5ML Pen, Inject 12.5 mg into the skin once a week., Disp: 2 mL, Rfl: 0  Past Medical History:  Diagnosis Date   Bipolar disorder, current episode depressed, moderate (HCC)    Essential (primary) hypertension    Morbid (severe) obesity due to excess calories (HCC)    Other hypertrophic disorders of the skin    Objective:  PHYSICAL EXAM:   BP 92/62 (BP Location: Left Arm, Patient Position: Sitting)   Pulse 85    Temp (!) 97.5 F (36.4 C) (Temporal)   Ht 5' 9 (1.753 m)   Wt 232 lb 12.8 oz (105.6 kg)   SpO2 100%   BMI 34.38 kg/m    GEN: Well nourished, well developed, in no acute distress  Cardiac: RRR; no murmurs, Respiratory:  normal respiratory rate and pattern with no distress - normal breath sounds with no rales, rhonchi, wheezes or rubs  Psych: euthymic mood, appropriate affect and demeanor  Assessment & Plan:    Malaise -     CBC with Differential/Platelet -     Comprehensive metabolic panel with GFR -     TSH  Anxiety with depression -     Citalopram Hydrobromide; Take 1 tablet (10 mg total) by mouth daily.  Dispense: 30 tablet; Refill: 1 May use klonopin  as needed Pt defers counseling Other orders -     Zepbound ; Inject 12.5 mg into the skin once a week.  Dispense: 2 mL; Refill: 0     Follow-up: Return for follow-up in 4-6 weeks.  An After Visit Summary was printed and given to the patient.  CAMIE JONELLE NICHOLAUS DEVONNA [facility] [phone]

## 2024-03-15 ENCOUNTER — Encounter: Payer: Self-pay | Admitting: Physician Assistant

## 2024-03-15 ENCOUNTER — Other Ambulatory Visit: Payer: Self-pay | Admitting: Physician Assistant

## 2024-03-15 MED ORDER — NORETHINDRONE ACETATE 5 MG PO TABS: ORAL_TABLET | ORAL | 0 refills | Status: AC

## 2024-03-16 ENCOUNTER — Other Ambulatory Visit: Payer: Self-pay | Admitting: Physician Assistant

## 2024-03-16 DIAGNOSIS — F419 Anxiety disorder, unspecified: Secondary | ICD-10-CM

## 2024-03-24 ENCOUNTER — Encounter: Payer: Self-pay | Admitting: Physician Assistant

## 2024-03-24 ENCOUNTER — Other Ambulatory Visit: Payer: Self-pay | Admitting: Physician Assistant

## 2024-04-08 ENCOUNTER — Other Ambulatory Visit: Payer: Self-pay | Admitting: Physician Assistant

## 2024-04-08 MED ORDER — PHENTERMINE HCL 37.5 MG PO TABS: 37.5000 mg | ORAL_TABLET | Freq: Every day | ORAL | 1 refills | Status: AC
